# Patient Record
Sex: Female | Born: 1995 | Race: Black or African American | Hispanic: No | Marital: Single | Smoking: Former smoker
Health system: Southern US, Community
[De-identification: ages and names within clinical notes are randomized; demographics above are authoritative.]

## PROBLEM LIST (undated history)

## (undated) HISTORY — PX: EYE SURGERY: SHX253

## (undated) HISTORY — PX: OTHER SURGICAL HISTORY: SHX169

---
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
## 2014-02-15 ENCOUNTER — Encounter (HOSPITAL_COMMUNITY): Payer: Self-pay | Admitting: Family Medicine

## 2014-02-15 ENCOUNTER — Emergency Department (HOSPITAL_COMMUNITY)
Admission: EM | Admit: 2014-02-15 | Discharge: 2014-02-15 | Disposition: A | Discharge status: Home / Self Care | Payer: 59 | Attending: Emergency Medicine | Admitting: Emergency Medicine

## 2014-02-15 DIAGNOSIS — K5641 Fecal impaction: Secondary | ICD-10-CM

## 2014-02-15 DIAGNOSIS — K5900 Constipation, unspecified: Secondary | ICD-10-CM

## 2014-02-15 DIAGNOSIS — K644 Residual hemorrhoidal skin tags: Secondary | ICD-10-CM | POA: Diagnosis not present

## 2014-02-15 MED ORDER — HYDROCORTISONE 2.5 % RE CREA: TOPICAL_CREAM | RECTAL | Status: DC

## 2014-02-15 MED ORDER — BISACODYL 10 MG RE SUPP
10.0000 mg | Freq: Once | RECTAL | Status: AC
Start: 2014-02-15 — End: 2014-02-15
  Administered 2014-02-15: 10 mg via RECTAL
  Filled 2014-02-15: qty 1

## 2014-02-15 MED ORDER — LACTULOSE 10 GM/15ML PO SOLN
20.0000 g | Freq: Once | ORAL | Status: AC
  Administered 2014-02-15: 20 g via ORAL
  Filled 2014-02-15: qty 30

## 2014-02-15 MED ORDER — POLYETHYLENE GLYCOL 3350 17 GM/SCOOP PO POWD: 17.0000 g | Freq: Two times a day (BID) | ORAL | Status: DC

## 2014-02-15 NOTE — Discharge Instructions (Signed)
1. Medications: anusol, miralax, usual home medications 2. Treatment: rest, drink plenty of fluids, begn health bowel habits 3. Follow Up: Please followup with your primary doctor in 3 days for discussion of your diagnoses and further evaluation after today's visit; if you do not have a primary care doctor use the resource guide provided to find one; Please return to the ER for abdominal pain, rectal bleeding, fever or other concerns   Constipation Constipation is when a person has fewer than three bowel movements a week, has difficulty having a bowel movement, or has stools that are dry, hard, or larger than normal. As people grow older, constipation is more common. If you try to fix constipation with medicines that make you have a bowel movement (laxatives), the problem may get worse. Long-term laxative use may cause the muscles of the colon to become weak. A low-fiber diet, not taking in enough fluids, and taking certain medicines may make constipation worse.  CAUSES   Certain medicines, such as antidepressants, pain medicine, iron supplements, antacids, and water pills.   Certain diseases, such as diabetes, irritable bowel syndrome (IBS), thyroid disease, or depression.   Not drinking enough water.   Not eating enough fiber-rich foods.   Stress or travel.   Lack of physical activity or exercise.   Ignoring the urge to have a bowel movement.   Using laxatives too much.  SIGNS AND SYMPTOMS   Having fewer than three bowel movements a week.   Straining to have a bowel movement.   Having stools that are hard, dry, or larger than normal.   Feeling full or bloated.   Pain in the lower abdomen.   Not feeling relief after having a bowel movement.  DIAGNOSIS  Your health care provider will take a medical history and perform a physical exam. Further testing may be done for severe constipation. Some tests may include:  A barium enema X-ray to examine your rectum, colon,  and, sometimes, your small intestine.   A sigmoidoscopy to examine your lower colon.   A colonoscopy to examine your entire colon. TREATMENT  Treatment will depend on the severity of your constipation and what is causing it. Some dietary treatments include drinking more fluids and eating more fiber-rich foods. Lifestyle treatments may include regular exercise. If these diet and lifestyle recommendations do not help, your health care provider may recommend taking over-the-counter laxative medicines to help you have bowel movements. Prescription medicines may be prescribed if over-the-counter medicines do not work.  HOME CARE INSTRUCTIONS   Eat foods that have a lot of fiber, such as fruits, vegetables, whole grains, and beans.  Limit foods high in fat and processed sugars, such as french fries, hamburgers, cookies, candies, and soda.   A fiber supplement may be added to your diet if you cannot get enough fiber from foods.   Drink enough fluids to keep your urine clear or pale yellow.   Exercise regularly or as directed by your health care provider.   Go to the restroom when you have the urge to go. Do not hold it.   Only take over-the-counter or prescription medicines as directed by your health care provider. Do not take other medicines for constipation without talking to your health care provider first.  SEEK IMMEDIATE MEDICAL CARE IF:   You have bright red blood in your stool.   Your constipation lasts for more than 4 days or gets worse.   You have abdominal or rectal pain.   You have thin,  pencil-like stools.   You have unexplained weight loss. MAKE SURE YOU:   Understand these instructions.  Will watch your condition.  Will get help right away if you are not doing well or get worse. Document Released: 11/25/2003 Document Revised: 03/03/2013 Document Reviewed: 12/08/2012 [facility] Patient Information 2015 [place], [place]. This information is not intended to  replace advice given to you by your health care provider. Make sure you discuss any questions you have with your health care provider.     Emergency Department Resource Guide 1) Find a Doctor and Pay Out of Pocket Although you won't have to find out who is covered by your insurance plan, it is a good idea to ask around and get recommendations. You will then need to call the office and see if the doctor you have chosen will accept you as a new patient and what types of options they offer for patients who are self-pay. Some doctors offer discounts or will set up payment plans for their patients who do not have insurance, but you will need to ask so you aren't surprised when you get to your appointment.  2) Contact Your Local Health Department Not all health departments have doctors that can see patients for sick visits, but many do, so it is worth a call to see if yours does. If you don't know where your local health department is, you can check in your phone book. The CDC also has a tool to help you locate your state's health department, and many state websites also have listings of all of their local health departments.  3) Find a Walk-in Clinic If your illness is not likely to be very severe or complicated, you may want to try a walk in clinic. These are popping up all over the country in pharmacies, drugstores, and shopping centers. They're usually staffed by nurse practitioners or physician assistants that have been trained to treat common illnesses and complaints. They're usually fairly quick and inexpensive. However, if you have serious medical issues or chronic medical problems, these are probably not your best option.  No Primary Care Doctor: - Call Health Connect at  [phone] - they can help you locate a primary care doctor that  accepts your insurance, provides certain services, etc. - Physician Referral Service- [phone]  Chronic Pain Problems: Organization          Address  Phone   Notes  [facility]  [phone] Patients need to be referred by their primary care doctor.   Medication Assistance: Organization         Address  Phone   Notes  [facility] Medication [facility] [address] [place], [place] [address] [phone] --Must be a resident of [facility] -- Must have NO insurance coverage whatsoever (no Medicaid/ Medicare, etc.) -- The pt. MUST have a primary care doctor that directs their care regularly and follows them in the community   [facility]ist  [phone]   [employer]  [phone]    Agencies that provide inexpensive medical care: Organization         Address  Phone   Notes  [facility]  [phone]   [facility]    [phone]   [facility] [address] [place], [place] [address] [phone]   [facility] [address] [place], [place] [phone]   Planned Parenthood    [phone]  [facility]    [phone]   [facility]  [address], [place] Phone:  [phone], Fax:  [phone]) [phone] Hours of Operation:  9 am - 6 pm, M-F.  Also accepts Medicaid/Medicare and self-pay.  [facility]  [address], [place] Phone: [phone], Fax: [phone]) [phone]. Hours of Operation:  8:30 am - 5:30 pm, M-F.  Also accepts Medicaid and self-pay.  [facility] [place] [address], [place] Phone: [phone]   [facility] [address], [place], [place] [phone], Ext. [phone] Mondays & Thursdays: 7-9 AM.  First 15 patients are seen on a first come, first serve basis.    Medicaid-accepting [facility] Providers:  Organization         Address  Phone   Notes  [facility] [address], [place] [phone]) [phone] Also accepts self-pay patients.  [facility] [address], [place]  [phone]   [facility] [address], [place] [phone]) [phone]   [facility] [address] [place] [address], [place] [phone]) [phone]   Renaye RakersVeita Bland [address], [place]   [phone]) [phone] Only accepts [place] Access [place] patients after they have their name applied to their card.   Self-Pay (no insurance) in [facility]:  Organization         Address  Phone   Notes  Sickle Cell Patients, [facility] [address], [place] [phone]) [phone]   [facility] [address], [place] [phone]) [phone]   [facility]  [address], [place] [phone](336) [phone]   [facility]/Dr. Osei-Bonsu  [address] [place] [address], [place] or [phone], [place] [phone]) [phone] Phone number for both [place] and [place] locations is the same.  [facility] [address], [place] [phone]) [phone]   [facility] [place] [address] [place] [address], [place] or [address] [phone]) [phone]   [facility] [address], [place] [phone](336) [phone], phone; [phone](336) [phone], fax Sees patients 1st and 3rd Saturday of every month.  Must not qualify for public or private insurance (i.e. Medicaid, Medicare, [place] Health Choice, Veterans' Benefits)  Household income should be no more than 200% of the poverty level The clinic cannot treat you if you are pregnant or think you are pregnant  Sexually transmitted diseases are not treated at the clinic.    Dental Care: Organization         Address  Phone  Notes  [facility] of [facility] [address] [place] [address], [place] [phone]) [phone] Accepts children up to age 20 who are enrolled in [place]icaid or [place] Health Choice; pregnant women with a Medicaid card; and  children who have applied for Medicaid or [place] Health Choice, but were declined, whose parents can pay a reduced fee at time of service.  [facility] of [facility] [place]  [address], [place] [phone]) [phone] Accepts children up to age 20 who are enrolled in [place] or [place] Health Choice; pregnant women with a Medicaid card; and children who have applied for Medicaid or [place] Health Choice, but were declined, whose parents can pay a reduced fee at time  of service.  [facility]  [address], [place] [phone]) [phone] Patients are seen by appointment only. Walk-ins are not accepted. [facility] will see patients 20 years of age and older. Monday - Tuesday (8am-5pm) Most Wednesdays (8:30-5pm) $30 per visit, cash only  [facility]  [address], [place] [facility] [phone] Patients are seen by appointment only. Walk-ins are not accepted. [facility] will see patients 20 years of age and older. One Wednesday Evening (Monthly: Volunteer Based).  $30 per visit, cash only  [employer] [facility] [facility]  [phone] for adults; Children under age 234, call Graduate Pediatric Dentistry at [phone]. Children aged 464-14, please call [phone] to request a pediatric application.  Dental services are provided in all areas of dental care including fillings, crowns and bridges, complete and partial dentures, implants, gum treatment, root canals, and extractions. Preventive care is also provided. Treatment is provided to both adults and children. Patients are selected via a lottery and there is often a waiting list.   [facility] [address], [place]  [phone] [email or web address]   [facility] [address], [place], [place] [phone], Ext. [phone] Second and Fourth Thursday of each month, opens at 6:30 AM; Clinic ends at 9 AM.  Patients are seen on a first-come first-served  basis, and a limited number are seen during each clinic.   [facility]  [address], [place], [place] [phone]) [phone]   Eligibility Requirements You must have lived in [place], [place], or [place] counties for at least the last three months.   You cannot be eligible for state or federal sponsored National Cityhealthcare insurance, including CIGNAVeterans Administration, [place]icaid, or [employer]Medicare.   You generally cannot be eligible for healthcare insurance through your employer.    How to apply: Eligibility screenings are held every Tuesday and Wednesday afternoon from 1:00 pm until 4:00 pm. You do not need an appointment for the interview!  [facility] [address], [place], [place] [phone]   [facility]  [phone]   [facility]  [phone]   [facility]  [phone]    Behavioral Health Resources in the Community: Intensive Outpatient Programs Organization         Address  Phone  Notes  [facility] [address], [place], [place] [phone]   [facility] [address], [place], [place] [phone]   [facility] [address], [place], [place]  [phone]   [facility] [address],  [place], [place] [phone] or [phone]   Substance Abuse Resources Organization         Address  Phone  Notes  [facility]  [phone]   [facility]  [phone]   [facility]  [phone]   [facility]  [phone]   Residential & Outpatient Substance Abuse Program  [phone]   Psychological Services Organization         Address  Phone  Notes  [facility]  [phone]- [phone]   [facility]  [phone]- [phone]   [facility] [address], [place] [phone] or [phone]    Mobile Crisis Teams Organization          Address  Phone  Notes  [facility], Mobile Crisis Care Unit  [phone]   [facility] Psychotherapeutic  Services  [address] [place], [place] [phone]   [facility] [address] [place] [phone]    Self-Help/Support Groups Organization         Address  Phone             Notes  [facility]ental [facility] Assoc[facility] of [place] - variety of support groups  [phone]- [phone] Call for more information  Narcotics Anonymous (NA), [facility] [address], [employer] [place]  2 meetings at this location   Statistician         Address  Phone  Notes  [facility] [address],    [place]  [phone]   [facility]  [address] [place] [address], [place], [place] [phone]   [facility] [address] [place], [place] [phone] Admissions: 8am-3pm M-F  [facility] [address]    [place], [place] [phone]   [facility] [address] [place], [place], [place] [phone]   [facility] [address]  [place], [place] [phone]   [facility] - Intensive Outpatient [address], [place], [place] [phone]   [facility] ([facility] [address]  [place], [place] [phone] or [phone]   [facility] ([facility]) [address], [place], [place] [phone] Accepts Medicaid  [facility] [address]  [place] [phone] Substance Abuse/Addiction Treatment   [facility] Organization         Address  Phone  Notes  [facility]  [phone]   Angie Fava, PhD [address] [place], [place]   [phone] or [phone]   [facility] Behavioral   [address] [place], [place] [phone]   [facility] [address] [place], [place] [phone] Insurance/Medicaid/sponsorship  through [facility] [address]                                    [place], [place] [phone] Therapy/tele-psych/case  [facility] [address], [place] [phone]    Dr. Lolly Mustache  [phone]   [facility] [place]  [employer] [facility] Dept. 1) [address] [place] 2) [address] [place] 3)  [address], [place] [phone]  [phone]   [facility] Child Abuse Hotline [phone] or [phone] (After Hours)

## 2014-02-15 NOTE — ED Notes (Signed)
Per pt sts she has been constipated x 5 days and feels ike it is right at her rectum and will not come out. sts pressure. Denies any other associated symptoms.

## 2014-02-15 NOTE — ED Provider Notes (Signed)
CSN: [id number]     Arrival date & time 02/15/14  1804 History   First MD Initiated Contact with Patient 02/15/14 1941     Chief Complaint  Patient presents with  . Fecal Impaction     (Consider location/radiation/quality/duration/timing/severity/associated sxs/prior Treatment) The history is provided by the patient and medical records. No language interpreter was used.     Daisy Torres is a 18 y.o. female  with no major medical Hx presents to the Emergency Department complaining of gradual, persistent, progressively worsening constipation onset 5 days ago.  Pt reports she normally has soft BMs every other day, but has not has not felt the urge to defecate this week.  Pt reports she drinks plenty of water (2-3 cups) and admits to not eating healthy including a large amount of fast food and junk food.  Pt reports she does not have a hx of constipation.  Pt reports she has not attempted to take anything for the constipation.  Associated symptoms include rectal pain, but denies fever chills, abd pain, hematochezia or melena.  No aggravating or alleviating factors.    History reviewed. No pertinent past medical history. History reviewed. No pertinent past surgical history. History reviewed. No pertinent family history. History  Substance Use Topics  . Smoking status: Never Smoker   . Smokeless tobacco: Not on file  . Alcohol Use: Not on file   OB History    No data available     Review of Systems  Constitutional: Negative for fever, diaphoresis, appetite change, fatigue and unexpected weight change.  HENT: Negative for mouth sores.   Eyes: Negative for visual disturbance.  Respiratory: Negative for cough, chest tightness, shortness of breath and wheezing.   Cardiovascular: Negative for chest pain.  Gastrointestinal: Positive for constipation. Negative for nausea, vomiting, abdominal pain and diarrhea.  Endocrine: Negative for polydipsia, polyphagia and polyuria.  Genitourinary:  Negative for dysuria, urgency, frequency and hematuria.  Musculoskeletal: Negative for back pain and neck stiffness.  Skin: Negative for rash.  Allergic/Immunologic: Negative for immunocompromised state.  Neurological: Negative for syncope, light-headedness and headaches.  Hematological: Does not bruise/bleed easily.  Psychiatric/Behavioral: Negative for sleep disturbance. The patient is not nervous/anxious.       Allergies  Review of patient's allergies indicates no known allergies.  Home Medications   Prior to Admission medications   Medication Sig Start Date End Date Taking? Authorizing Provider  hydrocortisone (ANUSOL-HC) 2.5 % rectal cream Apply rectally 2 times daily 02/15/14   Dahlia ClientHannah Tangie Stay, PA-C  polyethylene glycol powder (GLYCOLAX/MIRALAX) powder Take 17 g by mouth 2 (two) times daily. Until daily soft stools  OTC 02/15/14   Matthews Franks, PA-C   BP 96/53 mmHg  Pulse 87  Temp(Src) 99 F (37.2 C) (Oral)  Resp 16  Ht 5\' 4"  (1.626 m)  Wt 125 lb (56.7 kg)  BMI 21.45 kg/m2  SpO2 99% Physical Exam  Constitutional: She appears well-developed and well-nourished. No distress.  Awake, alert, nontoxic appearance  HENT:  Head: Normocephalic and atraumatic.  Mouth/Throat: Oropharynx is clear and moist. No oropharyngeal exudate.  Eyes: Conjunctivae are normal. No scleral icterus.  Neck: Normal range of motion. Neck supple.  Cardiovascular: Normal rate, regular rhythm, normal heart sounds and intact distal pulses.   No murmur heard. Pulmonary/Chest: Effort normal and breath sounds normal. No respiratory distress. She has no wheezes.  Equal chest expansion  Abdominal: Soft. Bowel sounds are normal. She exhibits no distension and no mass. There is no tenderness. There is no  rebound and no guarding.  Genitourinary: Rectal exam shows external hemorrhoid. Rectal exam shows no internal hemorrhoid, no fissure, no mass, no tenderness and anal tone normal.  External  hemorrhoid without fissure Large stool burden in the rectum with small amount of hard stool able to be disimpacted; additional stool is softer and does not feel impacted.  Musculoskeletal: Normal range of motion. She exhibits no edema.  Neurological: She is alert.  Speech is clear and goal oriented Moves extremities without ataxia  Skin: Skin is warm and dry. She is not diaphoretic.  Psychiatric: She has a normal mood and affect.  Nursing note and vitals reviewed.   ED Course  Fecal disimpaction Date/Time: 02/15/2014 10:09 PM Performed by: Dierdre ForthMUTHERSBAUGH, Rossie Bretado Authorized by: Dierdre ForthMUTHERSBAUGH, Albertina Leise Consent: Verbal consent obtained. Risks and benefits: risks, benefits and alternatives were discussed Consent given by: patient Patient understanding: patient states understanding of the procedure being performed Patient consent: the patient's understanding of the procedure matches consent given Procedure consent: procedure consent matches procedure scheduled Relevant documents: relevant documents present and verified Site marked: the operative site was marked Required items: required blood products, implants, devices, and special equipment available Patient identity confirmed: verbally with patient and arm band Time out: Immediately prior to procedure a "time out" was called to verify the correct patient, procedure, equipment, support staff and site/side marked as required. Local anesthesia used: no Patient sedated: no Patient tolerance: Patient tolerated the procedure well with no immediate complications Comments: Disimpaction of a small amount of hard brown stool   (including critical care time) Labs Review Labs Reviewed - No data to display  Imaging Review No results found.   EKG Interpretation None      MDM   Final diagnoses:  Constipation, unspecified constipation type  Fecal impaction in rectum  External hemorrhoid   Daisy MageShamari N Shumard presents with 5 days of  constipation.  DRE with large stool burden in the rectum and only a small amount of hard stool to be disimpacted. Remaining stool is softer in nature.  Patient to be given a laxative here in the emergency department and discharged home with Anusol and MiraLAX. No pain with DRE or to the perineum to suggest Fournier's gangrene, rectal abscess and no evidence of rectovaginal fistula. Abdomen soft and nontender without distention.   I have personally reviewed patient's vitals, nursing note and any pertinent labs or imaging.  I performed an undressed physical exam.    It has been determined that no acute conditions requiring further emergency intervention are present at this time. The patient/guardian have been advised of the diagnosis and plan. I reviewed all labs and imaging including any potential incidental findings. We have discussed signs and symptoms that warrant return to the ED and they are listed in the discharge instructions.    Vital signs are stable at discharge.   BP 96/53 mmHg  Pulse 87  Temp(Src) 99 F (37.2 C) (Oral)  Resp 16  Ht 5\' 4"  (1.626 m)  Wt 125 lb (56.7 kg)  BMI 21.45 kg/m2  SpO2 99%        Dierdre ForthHannah Storm Sovine, PA-C 02/15/14 2327  Dierdre ForthHannah Margarie Mcguirt, PA-C 02/15/14 2328  Rolland PorterMark James, MD 02/23/14 2244

## 2014-08-31 ENCOUNTER — Encounter: Payer: Self-pay | Admitting: Emergency Medicine

## 2014-08-31 ENCOUNTER — Emergency Department: Payer: Medicaid Other

## 2014-08-31 DIAGNOSIS — Z7952 Long term (current) use of systemic steroids: Secondary | ICD-10-CM | POA: Diagnosis not present

## 2014-08-31 DIAGNOSIS — Z79899 Other long term (current) drug therapy: Secondary | ICD-10-CM | POA: Diagnosis not present

## 2014-08-31 DIAGNOSIS — Y9289 Other specified places as the place of occurrence of the external cause: Secondary | ICD-10-CM | POA: Insufficient documentation

## 2014-08-31 DIAGNOSIS — S100XXA Contusion of throat, initial encounter: Secondary | ICD-10-CM | POA: Insufficient documentation

## 2014-08-31 DIAGNOSIS — Y998 Other external cause status: Secondary | ICD-10-CM | POA: Insufficient documentation

## 2014-08-31 DIAGNOSIS — Y9389 Activity, other specified: Secondary | ICD-10-CM | POA: Insufficient documentation

## 2014-08-31 DIAGNOSIS — X58XXXA Exposure to other specified factors, initial encounter: Secondary | ICD-10-CM | POA: Insufficient documentation

## 2014-08-31 DIAGNOSIS — J029 Acute pharyngitis, unspecified: Secondary | ICD-10-CM | POA: Diagnosis present
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
## 2014-09-01 ENCOUNTER — Emergency Department
Admission: EM | Admit: 2014-09-01 | Discharge: 2014-09-01 | Disposition: A | Discharge status: Home / Self Care | Payer: Medicaid Other | Attending: Emergency Medicine | Admitting: Emergency Medicine

## 2014-09-01 DIAGNOSIS — S100XXA Contusion of throat, initial encounter: Secondary | ICD-10-CM

## 2014-09-01 MED ORDER — IBUPROFEN 800 MG PO TABS
ORAL_TABLET | ORAL | Status: AC
  Administered 2014-09-01: 800 mg via ORAL
  Filled 2014-09-01: qty 1

## 2014-09-01 MED ORDER — GI COCKTAIL ~~LOC~~
ORAL | Status: AC
  Administered 2014-09-01: 30 mL via ORAL
  Filled 2014-09-01: qty 30

## 2014-09-01 MED ORDER — GI COCKTAIL ~~LOC~~
30.0000 mL | Freq: Once | ORAL | Status: AC
  Administered 2014-09-01: 30 mL via ORAL

## 2014-09-01 MED ORDER — IBUPROFEN 800 MG PO TABS
800.0000 mg | ORAL_TABLET | Freq: Once | ORAL | Status: AC
Start: 2014-09-01 — End: 2014-09-01
  Administered 2014-09-01: 800 mg via ORAL

## 2014-09-01 NOTE — Discharge Instructions (Signed)
Contusion A contusion is a deep bruise. Contusions are the result of an injury that caused bleeding under the skin. The contusion may turn blue, purple, or yellow. Minor injuries will give you a painless contusion, but more severe contusions may stay painful and swollen for a few weeks.  CAUSES  A contusion is usually caused by a blow, trauma, or direct force to an area of the body. SYMPTOMS   Swelling and redness of the injured area.  Bruising of the injured area.  Tenderness and soreness of the injured area.  Pain. DIAGNOSIS  The diagnosis can be made by taking a history and physical exam. An X-ray, CT scan, or MRI may be needed to determine if there were any associated injuries, such as fractures. TREATMENT  Specific treatment will depend on what area of the body was injured. In general, the best treatment for a contusion is resting, icing, elevating, and applying cold compresses to the injured area. Over-the-counter medicines may also be recommended for pain control. Ask your caregiver what the best treatment is for your contusion. HOME CARE INSTRUCTIONS   Put ice on the injured area.  Put ice in a plastic bag.  Place a towel between your skin and the bag.  Leave the ice on for 15-20 minutes, 3-4 times a day, or as directed by your health care provider.  Only take over-the-counter or prescription medicines for pain, discomfort, or fever as directed by your caregiver. Your caregiver may recommend avoiding anti-inflammatory medicines (aspirin, ibuprofen, and naproxen) for 48 hours because these medicines may increase bruising.  Rest the injured area.  If possible, elevate the injured area to reduce swelling. SEEK IMMEDIATE MEDICAL CARE IF:   You have increased bruising or swelling.  You have pain that is getting worse.  Your swelling or pain is not relieved with medicines. MAKE SURE YOU:   Understand these instructions.  Will watch your condition.  Will get help right  away if you are not doing well or get worse. Document Released: 12/06/2004 Document Revised: 03/03/2013 Document Reviewed: 01/01/2011 [facility] Patient Information 2015 [place], [place]. This information is not intended to replace advice given to you by your health care provider. Make sure you discuss any questions you have with your health care provider.  Soft Tissue Injury of the Neck A soft tissue injury of the neck may be either blunt or penetrating. A blunt injury does not break the skin. A penetrating injury breaks the skin, creating an open wound. Blunt injuries may happen in several ways. Most involve some type of direct blow to the neck. This can cause serious injury to the windpipe, voice box, cervical spine, or esophagus. In some cases, the injury to the soft tissue can also result in a break (fracture) of the cervical spine.  Soft tissue injuries of the neck require immediate medical care. Sometimes, you may not notice the signs of injury right away. You may feel fine at first, but the swelling may eventually close off your airway. This could result in a significant or life-threatening injury. This is rare, but it is important to keep in mind with any injury to the neck.  CAUSES  Causes of blunt injury may include:  "Clothesline" injuries. This happens when someone is moving at high speed and runs into a clothesline, outstretched arm, or similar object. This results in a direct injury to the front of the neck. If the airway is blocked, it can cause suffocation due to lack of oxygen (asphyxiation) or even  instant death.  High-energy trauma. This includes injuries from motor vehicle crashes, falling from a great height, or heavy objects falling onto the neck.  Sports-related injuries. Injury to the windpipe and voice box can result from being struck by another player or being struck by an object, such as a baseball, hockey stick, or an outstretched arm.  Strangulation. This type of injury may  cause skin trauma, hoarseness of voice, or broken cartilage in the voice box or windpipe. It may also cause a serious airway problem. SYMPTOMS   Bruising.  Pain and tenderness in the neck.  Swelling of the neck and face.  Hoarseness of voice.  Pain or difficulty with swallowing.  Drooling or inability to swallow.  Trouble breathing. This may become worse when lying flat.  Coughing up blood.  High-pitched, harsh, vibratory noise due to partial obstruction of the windpipe (stridor).  Swelling of the upper arms.  Windpipe that appears to be pushed off to one side.  Air in the tissues under the skin of the neck or chest (subcutaneous emphysema). This usually indicates a problem with the normal airway and is a medical emergency. DIAGNOSIS   If possible, your caregiver may ask about the details of how the injury occurred. A detailed exam can help to identify specific areas of the neck that are injured.  Your caregiver may ask for tests to rule out injury of the voice box, airway, or esophagus. This may include X-rays, ultrasounds, CT scans, or MRI scans, depending on the severity of your injury. TREATMENT  If you have an injury to your windpipe or voice box, immediate medical care is required. In almost all cases, hospitalization is necessary. For injuries that do not appear to require surgery, it is helpful to have medical observation for 24 hours. You may be asked to do one or more of the following:  Rest your voice.  Bed rest.  Limit your diet, depending on the extent of the injury. Follow your caregiver's dietary guidelines. Often, only fluids and soft foods are recommended.  Keep your head raised.  Breathe humidified air.  Take medicines to control infection, reduce swelling, and reduce normal stomach acid. You may also need pain medicine, depending on your injury. For injuries that appear to require surgery, you will need to stay in the hospital. The exact type of  procedure needed will depend on your exact injury or injuries.  HOME CARE INSTRUCTIONS   If the skin was broken, keep the wound area clean and dry. Wear your bandage (dressing) and care for your wound as instructed.  Follow your caregiver's advice about your diet.  Follow your caregiver's advice about use of your voice.  Take medicines as directed.  Keep your head and neck at least partially raised (elevated) while recovering. This should also be done while sleeping. SEEK MEDICAL CARE IF:   Your voice becomes weaker.  Your swelling or bruising is not improving as expected. Typically, this takes several days to improve.  You feel that you are having problems with medicines prescribed.  You have drainage from the injury site. This may be a sign that your wound is not healing properly or is infected.  You develop increasing pain or difficulty while swallowing.  You develop an oral temperature of 102 F (38.9 C) or higher. SEEK IMMEDIATE MEDICAL CARE IF:   You cough up blood.  You develop sudden trouble breathing.  You cannot tolerate your oral medicines, or you are unable to swallow.  You develop  drooling.  You have new or worsening vomiting.  You develop sudden, new swelling of the neck or face.  You have an oral temperature above 102 F (38.9 C), not controlled by medicine. MAKE SURE YOU:  Understand these instructions.  Will watch your condition.  Will get help right away if you are not doing well or get worse. Document Released: 06/05/2007 Document Revised: 05/21/2011 Document Reviewed: 05/15/2010 [facility] Patient Information 2015 [place], [place]. This information is not intended to replace advice given to you by your health care provider. Make sure you discuss any questions you have with your health care provider.
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
## 2015-04-14 LAB — HM PAP SMEAR: HM Pap smear: NEGATIVE

## 2015-06-01 ENCOUNTER — Emergency Department
Admission: EM | Admit: 2015-06-01 | Discharge: 2015-06-01 | Disposition: A | Discharge status: Home / Self Care | Payer: Self-pay | Attending: Emergency Medicine | Admitting: Emergency Medicine

## 2015-06-01 ENCOUNTER — Emergency Department: Payer: Self-pay

## 2015-06-01 DIAGNOSIS — Z79899 Other long term (current) drug therapy: Secondary | ICD-10-CM | POA: Insufficient documentation

## 2015-06-01 DIAGNOSIS — Z7952 Long term (current) use of systemic steroids: Secondary | ICD-10-CM | POA: Insufficient documentation

## 2015-06-01 DIAGNOSIS — F419 Anxiety disorder, unspecified: Secondary | ICD-10-CM | POA: Insufficient documentation

## 2015-06-01 DIAGNOSIS — R0789 Other chest pain: Secondary | ICD-10-CM | POA: Insufficient documentation

## 2015-06-01 MED ORDER — NAPROXEN 500 MG PO TABS: 500.0000 mg | ORAL_TABLET | Freq: Two times a day (BID) | ORAL | Status: DC

## 2015-06-01 MED ORDER — NAPROXEN 500 MG PO TABS
500.0000 mg | ORAL_TABLET | Freq: Once | ORAL | Status: AC
Start: 2015-06-01 — End: 2015-06-01
  Administered 2015-06-01: 500 mg via ORAL
  Filled 2015-06-01: qty 1

## 2015-06-01 NOTE — ED Notes (Signed)
Per Dr. Scotty CourtStafford, no lab work at this time.

## 2015-06-01 NOTE — ED Provider Notes (Signed)
EKG interpreted by me  Date: 06/01/2015  Rate: 84  Rhythm: normal sinus rhythm  QRS Axis: normal  Intervals: normal  ST/T Wave abnormalities: normal  Conduction Disutrbances: none  Narrative Interpretation: unremarkable      Daisy CheekPhillip Sydney Azure, MD 06/01/15 1919

## 2015-06-01 NOTE — ED Notes (Signed)
Pt reports chest discomfort when she gets anxious. Thinks its may be coming from have some anxiety and stress. Pt dealing with everyday life stressors. No thoughts of SI or HI. Pt in no acute distress at this time. Does have PCP.

## 2015-06-01 NOTE — ED Notes (Signed)
Pt c/o having substernal chest pain last night, states she has been having issues with it over the past year, states seems to flare up when she is under stress. Pt is in NAD at present.

## 2015-06-01 NOTE — ED Provider Notes (Signed)
[facility] Emergency Department Provider Note  ____________________________________________  Time seen: Approximately 12:12 PM  I have reviewed the triage vital signs and the nursing notes.   HISTORY  Chief Complaint Chest Pain   HPI Daisy Torres is a 20 y.o. female is here with complaint of chest pain. Patient states that she has chest discomfort when she gets anxious. She thinks that this is coming from a lot of anxiety and stress that she is under right now. She denies any thoughts of suicide. She denies taking any over-the-counter medication for her chest pain, denies the use of recreational drugs, no smoking. She denies any fever or chills. She states that her chest hurts when touching. Patient states she's had this in the past and was told that it was anxiety. She rates her pain is 7 out of 10.   History reviewed. No pertinent past medical history.  There are no active problems to display for this patient.   History reviewed. No pertinent past surgical history.  Current Outpatient Rx  Name  Route  Sig  Dispense  Refill  . hydrocortisone (ANUSOL-HC) 2.5 % rectal cream      Apply rectally 2 times daily   28.35 g   0   . naproxen (NAPROSYN) 500 MG tablet   Oral   Take 1 tablet (500 mg total) by mouth 2 (two) times daily with a meal.   30 tablet   2   . polyethylene glycol powder (GLYCOLAX/MIRALAX) powder   Oral   Take 17 g by mouth 2 (two) times daily. Until daily soft stools  OTC   119 g   2     Allergies Review of patient's allergies indicates no known allergies.  No family history on file.  Social History Social History  Substance Use Topics  . Smoking status: Never Smoker   . Smokeless tobacco: None  . Alcohol Use: No    Review of Systems Constitutional: No fever/chills ENT: No sore throat. Cardiovascular: Denies chest pain. Respiratory: Denies shortness of breath. Gastrointestinal: No abdominal pain.  No nausea, no  vomiting.  No diarrhea.   Genitourinary: Negative for dysuria. Musculoskeletal: Negative for back pain.Positive anterior chest wall pain. Skin: Negative for rash. Neurological: Negative for headaches, focal weakness or numbness.  10-point ROS otherwise negative.  ____________________________________________   PHYSICAL EXAM:  VITAL SIGNS: ED Triage Vitals  Enc Vitals Group     BP 06/01/15 1148 118/69 mmHg     Pulse Rate 06/01/15 1148 92     Resp 06/01/15 1148 15     Temp 06/01/15 1148 98.4 F (36.9 C)     Temp Source 06/01/15 1148 Oral     SpO2 06/01/15 1148 99 %     Weight 06/01/15 1148 138 lb (62.596 kg)     Height 06/01/15 1148  (1.626 m)     Head Cir --      Peak Flow --      Pain Score 06/01/15 1148 7     Pain Loc --      Pain Edu? --      Excl. in GC? --     Constitutional: Alert and oriented. Well appearing and in no acute distress.Patient is lying on stretcher Texting on the phone in no apparent distress. Eyes: Conjunctivae are normal. PERRL. EOMI. Head: Atraumatic. Nose: No congestion/rhinnorhea.EACs and TMs are clear bilaterally. Mouth/Throat: Mucous membranes are moist.  Oropharynx non-erythematous. Neck: No stridor.  Supple Hematological/Lymphatic/Immunilogical: No cervical lymphadenopathy. Cardiovascular: Normal  rate, regular rhythm. Grossly normal heart sounds.  Good peripheral circulation. Respiratory: Normal respiratory effort.  No retractions. Lungs CTAB. Gastrointestinal: Soft and nontender. No distention. Bowel sounds are normoactive. Musculoskeletal: Anterior chest wall and sternum were tender to palpation at the sternoclavicular joints. There is no gross deformity. Patient denies any reproduction of her pain on exertion. Neurologic:  Normal speech and language. No gross focal neurologic deficits are appreciated. No gait instability. Skin:  Skin is warm, dry and intact. No rash noted. Psychiatric: Mood and affect are normal. Speech and behavior  are normal.  ____________________________________________   LABS (all labs ordered are listed, but only abnormal results are displayed)  Labs Reviewed - No data to display RADIOLOGY  Chest x-ray per radiology shows no active cardiopulmonary disease. ____________________________________________   PROCEDURES  Procedure(s) performed: None  Critical Care performed: No  ____________________________________________   INITIAL IMPRESSION / ASSESSMENT AND PLAN / ED COURSE  Pertinent labs & imaging results that were available during my care of the patient were reviewed by me and considered in my medical decision making (see chart for details).  ----------------------------------------- 2:03 PM on 06/01/2015 ----------------------------------------- Patient has improved with her chest wall pain after taking naproxen.  ____________________________________________   FINAL CLINICAL IMPRESSION(S) / ED DIAGNOSES  Final diagnoses:  Anterior chest wall pain      Tommi RumpsRhonda L Summers, PA-C 06/01/15 1539  Myrna Blazeravid Matthew Schaevitz, MD 06/06/15 2040

## 2015-06-01 NOTE — Discharge Instructions (Signed)
Chest Wall Pain Chest wall pain is pain in or around the bones and muscles of your chest. Sometimes, an injury causes this pain. Sometimes, the cause may not be known. This pain may take several weeks or longer to get better. HOME CARE Pay attention to any changes in your symptoms. Take these actions to help with your pain:  Rest as told by your doctor.  Avoid activities that cause pain. Try not to use your chest, belly (abdominal), or side muscles to lift heavy things.  If directed, apply ice to the painful area:  Put ice in a plastic bag.  Place a towel between your skin and the bag.  Leave the ice on for 20 minutes, 2-3 times per day.  Take over-the-counter and prescription medicines only as told by your doctor.  Do not use tobacco products, including cigarettes, chewing tobacco, and e-cigarettes. If you need help quitting, ask your doctor.  Keep all follow-up visits as told by your doctor. This is important. GET HELP IF:  You have a fever.  Your chest pain gets worse.  You have new symptoms. GET HELP RIGHT AWAY IF:  You feel sick to your stomach (nauseous) or you throw up (vomit).  You feel sweaty or light-headed.  You have a cough with phlegm (sputum) or you cough up blood.  You are short of breath.   This information is not intended to replace advice given to you by your health care provider. Make sure you discuss any questions you have with your health care provider.   Document Released: 08/15/2007 Document Revised: 11/17/2014 Document Reviewed: 05/24/2014 Elsevier Interactive Patient Education 2016 [employer]  You [facility] to [facility] if any continued problems. Begin taking naproxen 500 mg twice a day with food. Return to the emergency room if any severe worsening of your symptoms such as shortness of breath or difficulty breathing.

## 2016-12-18 DIAGNOSIS — E663 Overweight: Secondary | ICD-10-CM | POA: Insufficient documentation

## 2016-12-19 DIAGNOSIS — F4323 Adjustment disorder with mixed anxiety and depressed mood: Secondary | ICD-10-CM | POA: Insufficient documentation

## 2017-05-11 ENCOUNTER — Emergency Department
Admission: EM | Admit: 2017-05-11 | Discharge: 2017-05-11 | Disposition: A | Discharge status: Home / Self Care | Payer: No Typology Code available for payment source | Attending: Emergency Medicine | Admitting: Emergency Medicine

## 2017-05-11 ENCOUNTER — Other Ambulatory Visit: Payer: Self-pay

## 2017-05-11 ENCOUNTER — Emergency Department: Payer: No Typology Code available for payment source

## 2017-05-11 DIAGNOSIS — M7918 Myalgia, other site: Secondary | ICD-10-CM | POA: Insufficient documentation

## 2017-05-11 DIAGNOSIS — Z79899 Other long term (current) drug therapy: Secondary | ICD-10-CM | POA: Insufficient documentation

## 2017-05-11 LAB — POCT PREGNANCY, URINE: Preg Test, Ur: NEGATIVE

## 2017-05-11 MED ORDER — KETOROLAC TROMETHAMINE 30 MG/ML IJ SOLN
30.0000 mg | Freq: Once | INTRAMUSCULAR | Status: AC
  Administered 2017-05-11: 30 mg via INTRAMUSCULAR
  Filled 2017-05-11: qty 1

## 2017-05-11 MED ORDER — MELOXICAM 7.5 MG PO TABS: 7.5000 mg | ORAL_TABLET | Freq: Every day | ORAL | 1 refills | Status: AC

## 2017-05-11 MED ORDER — CYCLOBENZAPRINE HCL 5 MG PO TABS: 5.0000 mg | ORAL_TABLET | Freq: Three times a day (TID) | ORAL | 0 refills | Status: AC | PRN

## 2017-05-11 MED ORDER — CYCLOBENZAPRINE HCL 10 MG PO TABS
5.0000 mg | ORAL_TABLET | Freq: Once | ORAL | Status: DC
  Filled 2017-05-11: qty 1
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
## 2018-05-09 LAB — HM HIV SCREENING LAB: HM HIV Screening: NEGATIVE
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
## 2018-11-06 ENCOUNTER — Ambulatory Visit: Payer: Self-pay

## 2018-11-20 ENCOUNTER — Other Ambulatory Visit: Payer: Self-pay

## 2018-11-20 ENCOUNTER — Ambulatory Visit (LOCAL_COMMUNITY_HEALTH_CENTER): Payer: Medicaid Other | Admitting: Physician Assistant

## 2018-11-20 VITALS — BP 125/85 | Ht 65.0 in | Wt 189.2 lb

## 2018-11-20 DIAGNOSIS — Z5321 Procedure and treatment not carried out due to patient leaving prior to being seen by health care provider: Secondary | ICD-10-CM

## 2018-11-20 NOTE — Progress Notes (Signed)
In for restart Depo; desires wet prep due to [place] s/s Debera Lat, RN Unable to wait to see provider-rescheduled appt Debera Lat, RN

## 2018-11-24 DIAGNOSIS — F4323 Adjustment disorder with mixed anxiety and depressed mood: Secondary | ICD-10-CM

## 2018-11-24 DIAGNOSIS — E669 Obesity, unspecified: Secondary | ICD-10-CM

## 2018-11-24 DIAGNOSIS — E663 Overweight: Secondary | ICD-10-CM

## 2018-11-25 ENCOUNTER — Ambulatory Visit: Payer: Medicaid Other

## 2018-12-15 ENCOUNTER — Encounter: Payer: Self-pay | Admitting: Advanced Practice Midwife

## 2018-12-15 ENCOUNTER — Other Ambulatory Visit: Payer: Self-pay

## 2018-12-15 ENCOUNTER — Ambulatory Visit (LOCAL_COMMUNITY_HEALTH_CENTER): Payer: Medicaid Other | Admitting: Advanced Practice Midwife

## 2018-12-15 VITALS — BP 131/87 | Ht 65.0 in | Wt 188.0 lb

## 2018-12-15 DIAGNOSIS — Z30013 Encounter for initial prescription of injectable contraceptive: Secondary | ICD-10-CM

## 2018-12-15 DIAGNOSIS — E663 Overweight: Secondary | ICD-10-CM

## 2018-12-15 DIAGNOSIS — Z3009 Encounter for other general counseling and advice on contraception: Secondary | ICD-10-CM

## 2018-12-15 DIAGNOSIS — Z113 Encounter for screening for infections with a predominantly sexual mode of transmission: Secondary | ICD-10-CM

## 2018-12-15 DIAGNOSIS — B9689 Other specified bacterial agents as the cause of diseases classified elsewhere: Secondary | ICD-10-CM

## 2018-12-15 LAB — WET PREP FOR TRICH, YEAST, CLUE
Trichomonas Exam: NEGATIVE
Yeast Exam: NEGATIVE

## 2018-12-15 LAB — PREGNANCY, URINE: Preg Test, Ur: NEGATIVE

## 2018-12-15 MED ORDER — MEDROXYPROGESTERONE ACETATE 150 MG/ML IM SUSP
150.0000 mg | Freq: Once | INTRAMUSCULAR | Status: AC
  Administered 2018-12-15: 150 mg via INTRAMUSCULAR

## 2018-12-15 MED ORDER — METRONIDAZOLE 500 MG PO TABS: 500.0000 mg | ORAL_TABLET | Freq: Two times a day (BID) | ORAL | 0 refills | Status: AC

## 2018-12-15 NOTE — Progress Notes (Signed)
Wet mount reviewed, patient treated for BV per SO. BP reviewed by provider, Depo given and next date for Depo given. Patient told to ask for PE when she calls for next Depo. Patient in agreement and states understanding of plan.Jenetta Downer, RN

## 2018-12-15 NOTE — Progress Notes (Signed)
Patient here for STD screening, thinks she may have BV or yeast. Also wants to restart depo today.Jenetta Downer, RN

## 2018-12-15 NOTE — Progress Notes (Signed)
   [place] problem visit  [facility] Department  Subjective:  Daisy Torres is a 23 y.o. nullip nonsmoker being seen today for STD screen and DMPA restart  Chief Complaint  Patient presents with  . Exposure to STD  . Contraception    Depo    HPI C/o white malodor discharge with external vaginal itching x 1 mo.  Wants to restart DMPA.  Went to [facility] in July for [place] butt lift surgery Nullip nonsmoker with LMP 2015.  Last sex 12/05/18 without condom  Does the patient have a current or past history of drug use? No   No components found for: HCV]   Health Maintenance Due  Topic Date Due  . TETANUS/TDAP  10/04/2012  . PAP-Cervical Cytology Screening  04/13/2018  . PAP SMEAR-Modifier  04/13/2018  . INFLUENZA VACCINE  10/11/2018    ROS  The following portions of the patient's history were reviewed and updated as appropriate: allergies, current medications, past family history, past medical history, past social history, past surgical history and problem list. Problem list updated.   See flowsheet for other program required questions.  Objective:   Vitals:   12/15/18 1439  BP: 131/87  Weight: 188 lb (85.3 kg)  Height: 5\' 5"  (1.651 m)    Physical Exam Constitutional:      Appearance: Normal appearance. She is obese.  HENT:     Head: Atraumatic.     Mouth/Throat:     Mouth: Mucous membranes are moist.  Eyes:     Conjunctiva/sclera: Conjunctivae normal.  Pulmonary:     Effort: Pulmonary effort is normal.     Breath sounds: Normal breath sounds.  Abdominal:     Palpations: Abdomen is soft.     Comments: Soft without tenderness Pt states had [place] butt lift 09/2018 in [place]  Genitourinary:    General: Normal vulva.     Exam position: Lithotomy position.     Labia:        Right: No lesion.        Left: No lesion.      Vagina: Vaginal discharge (white creamy leukorrhea, ph>4.5) present.     Cervix: Normal.     Uterus:  Normal.      Adnexa: Right adnexa normal and left adnexa normal.     Rectum: Normal.  Lymphadenopathy:     Lower Body: No right inguinal adenopathy. No left inguinal adenopathy.  Skin:    General: Skin is warm and dry.  Neurological:     Mental Status: She is alert.  Psychiatric:        Behavior: Behavior normal.       Assessment and Plan:  Daisy Torres is a 23 y.o. female presenting to the [facility] Department for a Women's Health problem visit  1. Overweight BMI=31.2  2. Screening examination for venereal disease Treat wet mount per standing orders Immunization nurse consult - WET PREP FOR [place], YEAST, [facility]  3. Family planning If PT neg today and BP =wnl, may have DMPA 150 mg IM today Please counsel on need for backup condoms/abstinance next 7 days - Pregnancy, urine     Return if symptoms worsen or fail to improve, for PRN.  No future appointments.  Herbie Saxon, CNM

## 2019-01-01 NOTE — Addendum Note (Signed)
Addended by: Tracye Szuch on: 01/01/2019 10:55 AM   Modules accepted: Orders  

## 2019-03-03 ENCOUNTER — Ambulatory Visit: Payer: Medicaid Other

## 2019-03-10 ENCOUNTER — Ambulatory Visit: Payer: Medicaid Other

## 2019-04-01 ENCOUNTER — Other Ambulatory Visit: Payer: Self-pay

## 2019-04-01 ENCOUNTER — Ambulatory Visit (LOCAL_COMMUNITY_HEALTH_CENTER): Payer: Medicaid Other | Admitting: Advanced Practice Midwife

## 2019-04-01 VITALS — BP 114/80 | Ht 64.5 in | Wt 192.0 lb

## 2019-04-01 DIAGNOSIS — Z30013 Encounter for initial prescription of injectable contraceptive: Secondary | ICD-10-CM | POA: Diagnosis not present

## 2019-04-01 DIAGNOSIS — Z3009 Encounter for other general counseling and advice on contraception: Secondary | ICD-10-CM

## 2019-04-01 DIAGNOSIS — Z3042 Encounter for surveillance of injectable contraceptive: Secondary | ICD-10-CM

## 2019-04-01 LAB — WET PREP FOR TRICH, YEAST, CLUE
Trichomonas Exam: NEGATIVE
Yeast Exam: NEGATIVE

## 2019-04-01 MED ORDER — MEDROXYPROGESTERONE ACETATE 150 MG/ML IM SUSP
150.0000 mg | Freq: Once | INTRAMUSCULAR | Status: AC
  Administered 2019-04-01: 150 mg via INTRAMUSCULAR

## 2019-04-01 MED ORDER — MEDROXYPROGESTERONE ACETATE 150 MG/ML IM SUSP: 150.0000 mg | INTRAMUSCULAR | 0 refills | Status: DC

## 2019-04-01 MED ORDER — METRONIDAZOLE 500 MG PO TABS: 500.0000 mg | ORAL_TABLET | Freq: Two times a day (BID) | ORAL | 0 refills | Status: AC

## 2019-04-01 NOTE — Progress Notes (Signed)
Wet prep reviewed-+BV, informed Rx will be sent to pharmacy; Depo adm. L. delt-well tolerated Sharlette Dense, RN

## 2019-04-01 NOTE — Progress Notes (Signed)
Pt wants to be checked for BV and yeast. Also wants depo today; last depo 12/15/2018; 15.2 weeks post depo today. Per [facility] records, last physical was 01/01/2018, PAP was due 04/2018, CBE due 2020; 04/14/2015 PAP was NIL.

## 2019-04-01 NOTE — Progress Notes (Signed)
   [facility]  Subjective:  Daisy Torres is a 24 y.o. SBF nullip being seen today for DMPA, pap, c/o malodor and white discharge x "few weeks"  Chief Complaint  Patient presents with  . Gynecologic Exam    c/o possible BV or yeast infection  . Contraception    depo (in arm)    HPI Last DMPA 12/15/18, last PE 01/01/18.  Last pap 04/14/15 wnl (pap due 04/2018).  Last sex 03/30/19 without condoms; with current partner x 3 years.  LMP ? With DMPA.   Does the patient have a current or past history of drug use? No   No components found for: HCV]   Health Maintenance Due  Topic Date Due  . TETANUS/TDAP  10/04/2012  . PAP-Cervical Cytology Screening  04/13/2018  . PAP SMEAR-Modifier  04/13/2018  . INFLUENZA VACCINE  10/11/2018    ROS  The following portions of the patient's history were reviewed and updated as appropriate: allergies, current medications, past family history, past medical history, past social history, past surgical history and problem list. Problem list updated.   See flowsheet for other program required questions.  Objective:   Vitals:   04/01/19 0854  BP: 114/80  Weight: 192 lb (87.1 kg)  Height: 5' 4.5" (1.638 m)    Physical Exam HENT:     Head: Normocephalic.  Pulmonary:     Effort: Pulmonary effort is normal.  Abdominal:     Palpations: Abdomen is soft. There is no mass.     Tenderness: There is no abdominal tenderness. There is no guarding.  Genitourinary:    General: Normal vulva.     Exam position: Lithotomy position.     Vagina: Vaginal discharge (thick white malodorous, ph>4.5) and erythema (vagina erythematous) present.     Cervix: Friability (friable to pap) present.     Uterus: Normal.      Adnexa: Right adnexa normal and left adnexa normal.     Rectum: Normal.  Skin:    General: Skin is warm and dry.  Neurological:     Mental Status: She is alert.        Assessment and Plan:  TENIA GOH is a 24 y.o. female presenting to the [facility] Department for a Women's Health problem visit  1. Family planning Treat wet mount per standing orders Immunization nurse consult - WET PREP FOR TRICH, YEAST, CLUE - Chlamydia/Gonorrhea [facility] - IGP, rfx Aptima HPV ASCU  2. Encounter for surveillance of injectable contraceptive May have DMPA 150 mg IM q 11-13 wks x 2 Needs physical and CBE that was due 2020      Return for 11-13 wk DMPA.  No future appointments.  Alberteen Spindle, CNM

## 2019-04-03 LAB — IGP, RFX APTIMA HPV ASCU: PAP Smear Comment: 0

## 2019-04-16 ENCOUNTER — Other Ambulatory Visit: Payer: Self-pay

## 2019-04-16 ENCOUNTER — Ambulatory Visit: Payer: Self-pay | Admitting: Physician Assistant

## 2019-04-16 ENCOUNTER — Encounter: Payer: Self-pay | Admitting: Physician Assistant

## 2019-04-16 DIAGNOSIS — B3731 Acute candidiasis of vulva and vagina: Secondary | ICD-10-CM

## 2019-04-16 DIAGNOSIS — B373 Candidiasis of vulva and vagina: Secondary | ICD-10-CM

## 2019-04-16 DIAGNOSIS — Z113 Encounter for screening for infections with a predominantly sexual mode of transmission: Secondary | ICD-10-CM

## 2019-04-16 LAB — WET PREP FOR TRICH, YEAST, CLUE: Trichomonas Exam: NEGATIVE

## 2019-04-16 MED ORDER — CLOTRIMAZOLE 1 % VA CREA: 1.0000 | TOPICAL_CREAM | Freq: Every day | VAGINAL | 0 refills | Status: AC

## 2019-04-16 NOTE — Progress Notes (Signed)
S:  Patient into clinic stating that she thinks that her BV is back.  States that she was here about 10 days ago and took all of the medicine for BV and is now having a white discharge with itching.  Denies any changes to her history from 04/01/2019 visit.  Denies any sexual activity since prior to last visit. O: WDWN female in NAD, A&Ox3, pleasant and cooperative.  Patient desired to self-collect wet mount.  Wet mount= many yeast, negative clue, amine and trich. A/P:  1.  Yeast needs treatment. 2.  Reviewed with patient history from last visit and patient denies any changes. 3.  Counseled that likely yeast is due to recent antibiotic use. 4.  Clotrimazole 1% vaginal cream use 1 app qhs for 7 days at bedtime. 5.  No sex for 7 days. 6.  Rec condoms with all sex. 7.  RTC prn.

## 2019-06-04 ENCOUNTER — Ambulatory Visit: Payer: Medicaid Other

## 2020-03-16 ENCOUNTER — Ambulatory Visit: Payer: Medicaid Other

## 2020-06-28 ENCOUNTER — Telehealth: Payer: Self-pay | Admitting: Licensed Clinical Social Worker

## 2020-06-28 NOTE — Telephone Encounter (Signed)
Pt. Left vm for LCSW requesting to reengage in services.

## 2020-07-01 ENCOUNTER — Ambulatory Visit: Payer: Medicaid Other

## 2020-07-11 ENCOUNTER — Ambulatory Visit (LOCAL_COMMUNITY_HEALTH_CENTER): Payer: Medicaid Other | Admitting: Family Medicine

## 2020-07-11 ENCOUNTER — Other Ambulatory Visit: Payer: Self-pay

## 2020-07-11 ENCOUNTER — Encounter: Payer: Self-pay | Admitting: Advanced Practice Midwife

## 2020-07-11 VITALS — BP 113/76 | Ht 65.0 in | Wt 166.0 lb

## 2020-07-11 DIAGNOSIS — Z30013 Encounter for initial prescription of injectable contraceptive: Secondary | ICD-10-CM

## 2020-07-11 DIAGNOSIS — Z3202 Encounter for pregnancy test, result negative: Secondary | ICD-10-CM | POA: Diagnosis not present

## 2020-07-11 DIAGNOSIS — Z3009 Encounter for other general counseling and advice on contraception: Secondary | ICD-10-CM | POA: Diagnosis not present

## 2020-07-11 DIAGNOSIS — Z113 Encounter for screening for infections with a predominantly sexual mode of transmission: Secondary | ICD-10-CM

## 2020-07-11 LAB — WET PREP FOR TRICH, YEAST, CLUE
Trichomonas Exam: NEGATIVE
Yeast Exam: NEGATIVE

## 2020-07-11 LAB — PREGNANCY, URINE: Preg Test, Ur: NEGATIVE

## 2020-07-11 MED ORDER — MEDROXYPROGESTERONE ACETATE 150 MG/ML IM SUSP
150.0000 mg | INTRAMUSCULAR | Status: AC
  Administered 2020-07-11 – 2021-02-15 (×2): 150 mg via INTRAMUSCULAR

## 2020-07-11 NOTE — Progress Notes (Signed)
PT negative, wet mount reviewed, no treatment indicated. Depo consent signed, depo given, left deltoid, tolerated well, next Depo card given.Burt Knack, RN

## 2020-07-11 NOTE — Progress Notes (Signed)
Family Planning Visit- Repeat Yearly Visit  Subjective:  Daisy Torres is a 25 y.o. G0P0000  being seen today for an annual wellness visit and to discuss contraception options.   The patient is currently using None for pregnancy prevention. Patient does not want a pregnancy in the next year. Patient has the following medical problems: has Adjustment disorder with mixed anxiety and depressed mood and Overweight on their problem list.  Chief Complaint  Patient presents with  . Annual Exam  . Contraception  . Exposure to STD    Patient reports here for physical, STI check and start BC.   Patient denies any questions or concerns   See flowsheet for other program required questions.   Body mass index is 27.62 kg/m. - Patient is eligible for diabetes screening based on BMI and age >7?  not applicable HA1C ordered? not applicable  Patient reports 2 of partners in last year. Desires STI screening?  Yes   Has patient been screened once for HCV in the past?  No  No results found for: HCVAB  Does the patient have current of drug use, have a partner with drug use, and/or has been incarcerated since last result? No  If yes-- Screen for HCV through [facility]   Does the patient meet criteria for HBV testing? No  Criteria:  -Household, sexual or needle sharing contact with HBV -History of drug use -HIV positive -Those with known Hep C   Health Maintenance Due  Topic Date Due  . Hepatitis C Screening  Never done  . COVID-19 Vaccine (1) Never done  . HPV VACCINES (1 - 2-dose series) Never done  . TETANUS/TDAP  Never done  . PAP-Cervical Cytology Screening  04/13/2018    Review of Systems  Constitutional: Negative for chills, fever, malaise/fatigue and weight loss.  HENT: Negative for congestion, hearing loss and sore throat.   Eyes: Negative for blurred vision, double vision and photophobia.  Respiratory: Negative for shortness of breath.   Cardiovascular: Negative for  chest pain.  Gastrointestinal: Negative for abdominal pain, blood in stool, constipation, diarrhea, heartburn, nausea and vomiting.  Genitourinary: Negative for dysuria and frequency.  Musculoskeletal: Negative for back pain, joint pain and neck pain.  Skin: Negative for itching and rash.  Neurological: Negative for dizziness, weakness and headaches.  Endo/Heme/Allergies: Does not bruise/bleed easily.  Psychiatric/Behavioral: Negative for depression, substance abuse and suicidal ideas.    The following portions of the patient's history were reviewed and updated as appropriate: allergies, current medications, past family history, past medical history, past social history, past surgical history and problem list. Problem list updated.  Objective:   Vitals:   07/11/20 1544  BP: 113/76  Weight: 166 lb (75.3 kg)  Height: 5\' 5"  (1.651 m)    Physical Exam Vitals and nursing note reviewed.  Constitutional:      Appearance: Normal appearance.  HENT:     Head: Normocephalic and atraumatic.     Mouth/Throat:     Mouth: Mucous membranes are moist.     Pharynx: No oropharyngeal exudate or posterior oropharyngeal erythema.  Eyes:     General: No scleral icterus. Neck:     Thyroid: No thyroid mass, thyromegaly or thyroid tenderness.  Cardiovascular:     Rate and Rhythm: Normal rate.     Pulses: Normal pulses.  Pulmonary:     Effort: Pulmonary effort is normal.  Abdominal:     General: Abdomen is flat. Bowel sounds are normal.  Palpations: Abdomen is soft.  Genitourinary:    Comments: Deferred, patient self collected Musculoskeletal:        General: Normal range of motion.  Skin:    General: Skin is warm and dry.  Neurological:     General: No focal deficit present.     Mental Status: She is alert.       Assessment and Plan:  Daisy Torres is a 25 y.o. female G0P0000 presenting to the [facility] Department for an yearly wellness and contraception visit   1.  Family planning  Contraception counseling: Reviewed all forms of birth control options in the tiered based approach. available including abstinence; over the counter/barrier methods; hormonal contraceptive medication including pill, patch, ring, injection,contraceptive implant, ECP; hormonal and nonhormonal IUDs; permanent sterilization options including vasectomy and the various tubal sterilization modalities. Risks, benefits, and typical effectiveness rates were reviewed.  Questions were answered.  Written information was also given to the patient to review.  Patient desires depo provera, this was prescribed for patient. She will follow up in 11-13 weeks  for surveillance.  She was told to call with any further questions, or with any concerns about this method of contraception.  Emphasized use of condoms 100% of the time for STI prevention.  Patient was offered ECP based on LMP.  - HIV [place] [facility] - Syphilis Serology, [place] [facility] - Pregnancy, urine  2. Screening examination for venereal disease  - Chlamydia/Gonorrhea [place] [facility] - WET PREP FOR TRICH, YEAST, CLUE - Pregnancy, urine - Chlamydia/Gonorrhea  Lab  3. Encounter for initial prescription of injectable contraceptive  DMPA IM  every 11-13 weeks x 1 year  - medroxyPROGESTERone (DEPO-PROVERA) injection 150 mg     Return in about 3 months (around 10/11/2020) for depo.  No future appointments.  Wendi Snipes, FNP

## 2020-07-11 NOTE — Progress Notes (Signed)
Patient here for PE , STD testing and to discuss BCM. Has used Depo in the past and would like to discuss other options. Sent to lab for blood work before seen by provider due to clinic running behind.Burt Knack, RN

## 2020-07-18 LAB — HM HIV SCREENING LAB: HM HIV Screening: NEGATIVE

## 2020-08-01 ENCOUNTER — Telehealth: Payer: Self-pay | Admitting: Licensed Clinical Social Worker

## 2020-08-01 NOTE — Telephone Encounter (Signed)
Patient left vm today requesting a call back.

## 2020-08-15 ENCOUNTER — Encounter: Payer: Self-pay | Admitting: Licensed Clinical Social Worker

## 2020-08-15 ENCOUNTER — Ambulatory Visit: Payer: Medicaid Other | Admitting: Licensed Clinical Social Worker

## 2020-08-15 DIAGNOSIS — F411 Generalized anxiety disorder: Secondary | ICD-10-CM
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
## 2020-08-19 ENCOUNTER — Ambulatory Visit: Payer: Medicaid Other

## 2020-08-22 ENCOUNTER — Ambulatory Visit: Payer: Medicaid Other | Admitting: Licensed Clinical Social Worker

## 2020-08-22 DIAGNOSIS — F411 Generalized anxiety disorder: Secondary | ICD-10-CM

## 2020-08-22 NOTE — Progress Notes (Signed)
Counselor/Therapist Progress Note  Patient ID: Daisy Torres, MRN: [id number],    Date: 08/22/2020  Time Spent: 45 minutes   Treatment Type: Psychotherapy  Reported Symptoms: Mild anxiety, anxious thought patterns   Mental Status Exam:  Appearance:   Casual     Behavior:  Appropriate and Sharing  Motor:  Normal  Speech/Language:   Clear and Coherent and Normal Rate  Affect:  Appropriate and Full Range  Mood:  euthymic  Thought process:  goal directed  Thought content:    WNL  Sensory/Perceptual disturbances:    WNL  Orientation:  oriented to person, place, time/date, and situation  Attention:  Good  Concentration:  Good  Memory:  WNL  Fund of knowledge:   Good  Insight:    Good  Judgment:   Good  Impulse Control:  Good   Risk Assessment: Danger to Self:  No Self-injurious Behavior: No Danger to Others: No Duty to Warn:no Physical Aggression / Violence:No  Access to Firearms a concern: No  Gang Involvement:No   Subjective: Patient was engaged and cooperative throughout the session using time effectively to discuss thoughts, feelings, and treatment plan. Patient voices continued motivation for treatment and understanding of anxiety related issues. Patient is likely to benefit from future treatment because she is motivated to decrease symptoms and improve functioning.   Interventions: Cognitive Behavioral Therapy Established psychological safety. Checked in with patient and reviewed previous session, including assessment and goal of treatment. Reviewed CBTs. Explored patient's goal of treatment and worked collaboratively to develop CBT treatment plan. Encouraged patient to take note of when relationship issues arise and when things are good. Provided support through active listening, validation of feelings, and highlighted patient's strengths.  Diagnosis:   ICD-10-CM   1. GAD (generalized anxiety disorder)  F41.1      Plan: Patient's goal of treatment is to be a better  person. To stop the toxic things like saying things out of anger.   Treatment Target: Understand the relationship between thoughts, emotions, and behaviors  Psychoeducation on CBT model   Teach the connection between thoughts, emotions, and behaviors  Treatment Target: Increase realistic balanced thinking -to learn how to replace thinking with thoughts that are more accurate or helpful Explore patient's thoughts, beliefs, automatic thoughts, assumptions  Identify and replace unhelpful thinking patterns (upsetting ideas, self-talk and mental images) Process distress and allow for emotional release  Questioning and challenging thoughts Cognitive reappraisal  Restructuring, Socratic questioning   Treatment Target: Increase coping skills to regulate emotions  Mindfulness practices  Deep breathing  Grounding techniques as necessary  Values clarification  Future Appointments  Date Time Provider Department Center  08/29/2020  1:00 PM Kathreen Cosier, LCSW [facility] None    Kathreen Cosier, LCSW

## 2020-08-29 ENCOUNTER — Ambulatory Visit: Payer: Medicaid Other | Admitting: Licensed Clinical Social Worker

## 2020-08-29 DIAGNOSIS — F411 Generalized anxiety disorder: Secondary | ICD-10-CM

## 2020-08-29 NOTE — Progress Notes (Signed)
Counselor/Therapist Progress Note  Patient ID: Daisy Torres, MRN: [id number],    Date: 08/29/2020  Time Spent: 30 minutes    Treatment Type: Psychotherapy  Reported Symptoms:  continued mood stability and managed anxiety  Mental Status Exam:  Appearance:   Casual     Behavior:  Appropriate and Sharing  Motor:  Normal  Speech/Language:   Clear and Coherent  Affect:  Appropriate and Congruent  Mood:  euthymic  Thought process:  goal directed  Thought content:    WNL  Sensory/Perceptual disturbances:    WNL  Orientation:  oriented to person, place, time/date, situation, and day of week  Attention:  Good  Concentration:  Good  Memory:  WNL  Fund of knowledge:   Good  Insight:    Good  Judgment:   Good  Impulse Control:  Good   Risk Assessment: Danger to Self:  No Self-injurious Behavior: No Danger to Others: No Duty to Warn:no Physical Aggression / Violence:No  Access to Firearms a concern: No  Gang Involvement:No   Subjective: Patient was engaged and cooperative throughout the session using time effectively to discuss thoughts and feelings. Patient voices continued stability and managed anxiety. She reports meeting her goal of treatment of managing anger and not saying things out of anger.  Patient voices she would like to reach out for appt.'s as needed.    Interventions: Cognitive Behavioral Therapy Established psychological safety. Checked in with patient regarding her week. Provided supportive space encouraging emotional release and processing of current psychosocial stressors, overall improvement in symptoms and no stressors. Highlighted self-care behaviors of daily exercise and healthy eating and encouraged patient to continue this practice. Also praised patient for use of radical acceptance and pausing before responding. Provided support through active listening, validation of feelings, and highlighted patient's strengths. Encouraged patient to return as needed.    Diagnosis:   ICD-10-CM   1. GAD (generalized anxiety disorder)  F41.1      Plan: Patient will schedule appt. As needed.   No future appointments.  Kathreen Cosier, LCSW

## 2020-09-28 ENCOUNTER — Ambulatory Visit: Payer: Medicaid Other

## 2020-10-28 ENCOUNTER — Ambulatory Visit: Payer: Medicaid Other

## 2020-11-23 ENCOUNTER — Ambulatory Visit (LOCAL_COMMUNITY_HEALTH_CENTER): Payer: Medicaid Other | Admitting: Advanced Practice Midwife

## 2020-11-23 ENCOUNTER — Other Ambulatory Visit: Payer: Self-pay

## 2020-11-23 VITALS — BP 124/78 | HR 71 | Temp 98.0°F | Resp 16 | Ht 65.0 in | Wt 164.0 lb

## 2020-11-23 DIAGNOSIS — Z3202 Encounter for pregnancy test, result negative: Secondary | ICD-10-CM

## 2020-11-23 DIAGNOSIS — Z3009 Encounter for other general counseling and advice on contraception: Secondary | ICD-10-CM | POA: Diagnosis not present

## 2020-11-23 DIAGNOSIS — Z30013 Encounter for initial prescription of injectable contraceptive: Secondary | ICD-10-CM | POA: Diagnosis not present

## 2020-11-23 LAB — PREGNANCY, URINE: Preg Test, Ur: NEGATIVE

## 2020-11-23 MED ORDER — MEDROXYPROGESTERONE ACETATE 150 MG/ML IM SUSP
150.0000 mg | Freq: Once | INTRAMUSCULAR | Status: AC
  Administered 2020-11-23: 150 mg via INTRAMUSCULAR

## 2020-11-23 MED ORDER — LEVONORGESTREL 1.5 MG PO TABS: 1.5000 mg | ORAL_TABLET | Freq: Once | ORAL | 0 refills | Status: AC

## 2020-11-23 NOTE — Progress Notes (Signed)
Patient is here for acute FP visit to restart depo.   Last physical exam was done 07/11/20. Last depo was given 07/11/20 as well. Patient is 19w 2d post depo. Last sex was 11/22/20 without a condom.   Pregnancy test today - negative.   Floy Sabina, RN

## 2020-11-23 NOTE — Progress Notes (Signed)
19w 2d weeks post depo. Seen by provider Hazle Coca, CNM today.   Voices no concerns. Depo given today per  order of E. Sciora, CNM given today following negative pregnancy test today.   Physical exam up to date done on 07/11/20.   Depo administered R. Deltoid tolerated well. Next Depo due 02/08/21. Depo consent signed today. Reminder card given to patient.    Given today to patient Plan B as well, last unprotected sex was 11/22/20. Patient ordered to take at-home pregnancy test in 2 weeks on 12/06/20, patient agrees.   Floy Sabina, RN

## 2020-11-23 NOTE — Progress Notes (Signed)
Contraception/Family Planning VISIT ENCOUNTER NOTE  Subjective:   Daisy Torres is a 25 y.o. SBF  nonsmoker G0P0000 female here for reproductive life counseling.  Desires DMPA restart for BCM.  Reports she does not want a pregnancy in the next year. Denies abnormal vaginal bleeding, discharge, pelvic pain, problems with intercourse or other gynecologic concerns.  Wants to reinitiate DMPA. Last DMPA5/2/22 (19 2/7). Last PE 07/11/20. Last pap 04/01/19 neg. Last sex yesterday without condom; with current partner since 02/2016; 1 partner in last 3 mo. Denies cigs, vaping, cigars.   Gynecologic History No LMP recorded (lmp unknown). Patient has had an injection. Contraception:  none  Health Maintenance Due  Topic Date Due   COVID-19 Vaccine (1) Never done   Hepatitis C Screening  Never done   TETANUS/TDAP  Never done   PAP-Cervical Cytology Screening  04/13/2018   INFLUENZA VACCINE  Never done     The following portions of the patient's history were reviewed and updated as appropriate: allergies, current medications, past family history, past medical history, past social history, past surgical history and problem list.  Review of Systems Pertinent items are noted in HPI.   Objective:  BP 124/78   Pulse 71   Temp 98 F (36.7 C) (Oral)   Resp 16   Ht 5\' 5"  (1.651 m)   Wt 164 lb (74.4 kg)   LMP  (LMP Unknown) Comment: No peiords while on depo  BMI 27.29 kg/m  Gen: well appearing, NAD HEENT: no scleral icterus CV: RR Lung: Normal WOB Ext: warm well perfused   Assessment and Plan:   Contraception counseling: Reviewed all forms of birth control options in the tiered based approach. available including abstinence; over the counter/barrier methods; hormonal contraceptive medication including pill, patch, ring, injection,contraceptive implant, ECP; hormonal and nonhormonal IUDs; permanent sterilization options including vasectomy and the various tubal sterilization modalities. Risks,  benefits, and typical effectiveness rates were reviewed.  Questions were answered.  Written information was also given to the patient to review.  Patient desires DMPA reinitiation, this was prescribed for patient. She will follow up in  11-13 wks for surveillance.  She was told to call with any further questions, or with any concerns about this method of contraception.  Emphasized use of condoms 100% of the time for STI prevention.  Patient was offered ECP. ECP was accepted by the patient. ECP counseling was given - see RN documentation  1. Family planning  - Pregnancy, urine  2. Encounter for initial prescription of injectable contraceptive May have DMPA 150 mg IM x1 per 07/11/20 order if PT neg today per pt request Pt desires Plan B today; pt understands she may be pregnant from yesterday and that Plan B is not 100% effective at preventing pregnancy Pt counseled to do PT 12/06/20 and call 12/08/20 if + - medroxyPROGESTERone (DEPO-PROVERA) injection 150 mg    Please refer to After Visit Summary for other counseling recommendations.   No follow-ups on file.  [place], CNM [facility] DEPARTMENT

## 2021-02-15 ENCOUNTER — Ambulatory Visit (LOCAL_COMMUNITY_HEALTH_CENTER): Payer: Medicaid Other

## 2021-02-15 ENCOUNTER — Other Ambulatory Visit: Payer: Self-pay

## 2021-02-15 VITALS — BP 112/79 | Ht 65.0 in | Wt 163.0 lb

## 2021-02-15 DIAGNOSIS — Z3042 Encounter for surveillance of injectable contraceptive: Secondary | ICD-10-CM

## 2021-02-15 DIAGNOSIS — Z3009 Encounter for other general counseling and advice on contraception: Secondary | ICD-10-CM

## 2021-02-15 DIAGNOSIS — Z30013 Encounter for initial prescription of injectable contraceptive: Secondary | ICD-10-CM | POA: Diagnosis not present

## 2021-02-15 NOTE — Progress Notes (Signed)
12 weeks 0 days post depo. Voices no concerns. Depo given today per order by Elveria Rising, FNP dated 07/11/2020. Tolerated well L delt. Has reminder for next depo, approx 05/03/2021. Jerel Shepherd, RN

## 2021-03-09 ENCOUNTER — Ambulatory Visit: Payer: Medicaid Other

## 2021-05-17 ENCOUNTER — Ambulatory Visit: Payer: Medicaid Other

## 2021-05-17 ENCOUNTER — Other Ambulatory Visit: Payer: Self-pay

## 2021-05-17 ENCOUNTER — Ambulatory Visit (LOCAL_COMMUNITY_HEALTH_CENTER): Payer: Medicaid Other | Admitting: Advanced Practice Midwife

## 2021-05-17 VITALS — BP 122/83 | Wt 162.2 lb

## 2021-05-17 DIAGNOSIS — Z113 Encounter for screening for infections with a predominantly sexual mode of transmission: Secondary | ICD-10-CM

## 2021-05-17 DIAGNOSIS — Z3009 Encounter for other general counseling and advice on contraception: Secondary | ICD-10-CM

## 2021-05-17 DIAGNOSIS — Z309 Encounter for contraceptive management, unspecified: Secondary | ICD-10-CM | POA: Diagnosis not present

## 2021-05-17 DIAGNOSIS — Z3042 Encounter for surveillance of injectable contraceptive: Secondary | ICD-10-CM

## 2021-05-17 LAB — WET PREP FOR TRICH, YEAST, CLUE
Trichomonas Exam: NEGATIVE
Yeast Exam: NEGATIVE

## 2021-05-17 LAB — HM HEPATITIS C SCREENING LAB: HM Hepatitis Screen: NEGATIVE

## 2021-05-17 LAB — HM HIV SCREENING LAB: HM HIV Screening: NEGATIVE

## 2021-05-17 MED ORDER — MEDROXYPROGESTERONE ACETATE 150 MG/ML IM SUSP
150.0000 mg | Freq: Once | INTRAMUSCULAR | Status: AC
  Administered 2021-05-17: 150 mg via INTRAMUSCULAR

## 2021-05-17 NOTE — Progress Notes (Signed)
[facility] Department ? ?STI clinic/screening visit ?[address] ?[place] [address] ?[phone] ? ?Subjective:  ?Daisy Torres is a 26 y.o. SBF nullip female being seen today for an STI screening visit. The patient reports they do not have symptoms.  Patient reports that they do not desire a pregnancy in the next year.   They reported they are not interested in discussing contraception today.   ? ?No LMP recorded. Patient has had an injection. ? ? ?Patient has the following medical conditions:   ?Patient Active Problem List  ? Diagnosis Date Noted  ? Adjustment disorder with mixed anxiety and depressed mood 12/19/2016  ? Overweight BMI=27.2 12/18/2016  ? ? ?Chief Complaint  ?Patient presents with  ? Contraception  ? SEXUALLY TRANSMITTED DISEASE  ? ? ?HPI ? ?Patient reports here for STD screen and DMPA. States last sex condom broke on 05/09/21; first sex with this partner; 1 partner in last 3 mo. Last DMPA 02/15/21 (12 6/7). Happy with DMPA. Last PE 07/11/20. ? ?Last HIV test per patient/review of record was 07/18/20 ?Patient reports last pap was 04/01/19 neg ? ?Screening for MPX risk: ?Does the patient have an unexplained rash? No ?Is the patient MSM? No ?Does the patient endorse multiple sex partners or anonymous sex partners? No ?Did the patient have close or sexual contact with a person diagnosed with MPX? No ?Has the patient traveled outside the [place] where MPX is endemic? No ?Is there a high clinical suspicion for MPX-- evidenced by one of the following No ? -Unlikely to be chickenpox ? -Lymphadenopathy ? -Rash that present in same phase of evolution on any given body part ?See flowsheet for further details and programmatic requirements.  ? ? ?The following portions of the patient's history were reviewed and updated as appropriate: allergies, current medications, past medical history, past social history, past surgical history and problem list. ? ?Objective:  ? ?Vitals:  ? 05/17/21 0843   ?BP: 122/83  ?Weight: 162 lb 3.2 oz (73.6 kg)  ? ? ?Physical Exam ?Vitals and nursing note reviewed.  ?Constitutional:   ?   Appearance: Normal appearance. She is normal weight.  ?HENT:  ?   Head: Normocephalic and atraumatic.  ?   Mouth/Throat:  ?   Mouth: Mucous membranes are moist.  ?   Pharynx: Oropharynx is clear. No oropharyngeal exudate or posterior oropharyngeal erythema.  ?Eyes:  ?   Conjunctiva/sclera: Conjunctivae normal.  ?Pulmonary:  ?   Effort: Pulmonary effort is normal.  ?Abdominal:  ?   General: Abdomen is flat.  ?   Palpations: Abdomen is soft. There is no mass.  ?   Tenderness: There is no abdominal tenderness. There is no rebound.  ?   Comments: Soft without masses or tenderness, good tone  ?Genitourinary: ?   General: Normal vulva.  ?   Exam position: Lithotomy position.  ?   Pubic Area: No rash or pubic lice.   ?   Labia:     ?   Right: No rash or lesion.     ?   Left: No rash or lesion.   ?   Vagina: Vaginal discharge (white creamy leukorrhea, ph<4.5) present. No erythema, bleeding or lesions.  ?   Cervix: Normal.  ?   Uterus: Normal.   ?   Adnexa: Right adnexa normal and left adnexa normal.  ?   Rectum: Normal.  ?Lymphadenopathy:  ?   Head:  ?   Right side of head: No  preauricular or posterior auricular adenopathy.  ?   Left side of head: No preauricular or posterior auricular adenopathy.  ?   Cervical: No cervical adenopathy.  ?   Right cervical: No superficial, deep or posterior cervical adenopathy. ?   Left cervical: No superficial, deep or posterior cervical adenopathy.  ?   Upper Body:  ?   Right upper body: No supraclavicular or axillary adenopathy.  ?   Left upper body: No supraclavicular or axillary adenopathy.  ?   Lower Body: No right inguinal adenopathy. No left inguinal adenopathy.  ?Skin: ?   General: Skin is warm and dry.  ?   Findings: No rash.  ?Neurological:  ?   Mental Status: She is alert and oriented to person, place, and time.  ? ? ? ?Assessment and Plan:  ?YONA KOSEK is a 26 y.o. female presenting to the [facility] Department for STI screening ? ?1. Screening examination for venereal disease ?Treat wet mount per standing orders ?Immunization nurse consult ?- WET PREP FOR TRICH, YEAST, CLUE ?- Chlamydia/Gonorrhea [facility] ?- HIV/HCV [place] ?- Syphilis Serology, [facility] ? ?2. Family planning ?May have DMPA 150 mg IM per 07/11/20 standing order ?Needs physical 07/2021 ?- medroxyPROGESTERone (DEPO-PROVERA) injection 150 mg ? ?3. Encounter for surveillance of injectable contraceptive ?May have DMPA 150 mg IM per 07/11/20 standing order ? ? ? ? ?No follow-ups on file. ? ?No future appointments. ? ?Alberteen Spindle, CNM ?

## 2021-05-17 NOTE — Progress Notes (Signed)
Patient here for STD testing and depo shot. Depo charted. Given in R deltoid. Appt reminder card offered to patient. Condoms given. Wet prep reviewed, no tx per standing orders.  ?

## 2021-08-10 ENCOUNTER — Ambulatory Visit: Payer: Medicaid Other

## 2022-05-21 ENCOUNTER — Other Ambulatory Visit: Payer: Self-pay | Admitting: Family Medicine

## 2022-05-21 ENCOUNTER — Ambulatory Visit (LOCAL_COMMUNITY_HEALTH_CENTER): Payer: Medicaid Other

## 2022-05-21 VITALS — BP 122/83 | Ht 65.0 in | Wt 145.5 lb

## 2022-05-21 DIAGNOSIS — Z30012 Encounter for prescription of emergency contraception: Secondary | ICD-10-CM | POA: Diagnosis not present

## 2022-05-21 DIAGNOSIS — Z309 Encounter for contraceptive management, unspecified: Secondary | ICD-10-CM

## 2022-05-21 DIAGNOSIS — Z3009 Encounter for other general counseling and advice on contraception: Secondary | ICD-10-CM

## 2022-05-21 MED ORDER — LEVONORGESTREL 1.5 MG PO TABS: 1.5000 mg | ORAL_TABLET | Freq: Once | ORAL | 0 refills | Status: AC

## 2022-05-21 NOTE — Progress Notes (Signed)
In nurse clinic requesting ECP. Unprotected sex 05/19/2022 and before then on 04/26/2022. LMP 04/26/2022 lasting 3 weeks. Depo stopped approx 1 yr ago and LMP on 04/26/22 was first period since stopping depo. Pt has medicaid. Also interested in [facility] appt to discuss bc options.   Consult Art Buff, FNP who orders Plan B and sends E Rx to pt pharmacy ([facility] on [address]).   RN explained this to pt. ECP consent and info sheet reviewed and consent signed. Questions answered and reports understanding. Pt sent to clerk and  [facility] appt scheduled for 05/24/2022. Josie Saunders, RN

## 2022-05-24 ENCOUNTER — Encounter: Payer: Self-pay | Admitting: Family Medicine

## 2022-05-24 ENCOUNTER — Ambulatory Visit (LOCAL_COMMUNITY_HEALTH_CENTER): Payer: Medicaid Other | Admitting: Family Medicine

## 2022-05-24 VITALS — BP 118/81 | HR 83 | Ht 65.0 in | Wt 147.5 lb

## 2022-05-24 DIAGNOSIS — Z113 Encounter for screening for infections with a predominantly sexual mode of transmission: Secondary | ICD-10-CM

## 2022-05-24 DIAGNOSIS — Z309 Encounter for contraceptive management, unspecified: Secondary | ICD-10-CM

## 2022-05-24 DIAGNOSIS — Z01419 Encounter for gynecological examination (general) (routine) without abnormal findings: Secondary | ICD-10-CM

## 2022-05-24 DIAGNOSIS — Z30430 Encounter for insertion of intrauterine contraceptive device: Secondary | ICD-10-CM | POA: Diagnosis not present

## 2022-05-24 DIAGNOSIS — B3731 Acute candidiasis of vulva and vagina: Secondary | ICD-10-CM

## 2022-05-24 DIAGNOSIS — Z3009 Encounter for other general counseling and advice on contraception: Secondary | ICD-10-CM

## 2022-05-24 DIAGNOSIS — Z3202 Encounter for pregnancy test, result negative: Secondary | ICD-10-CM

## 2022-05-24 LAB — WET PREP FOR TRICH, YEAST, CLUE: Trichomonas Exam: NEGATIVE

## 2022-05-24 LAB — PREGNANCY, URINE: Preg Test, Ur: NEGATIVE

## 2022-05-24 MED ORDER — LEVONORGESTREL 20.1 MCG/DAY IU IUD
1.0000 | INTRAUTERINE_SYSTEM | Freq: Once | INTRAUTERINE | Status: AC
  Administered 2022-05-24: 1 via INTRAUTERINE

## 2022-05-24 MED ORDER — LEVONORGESTREL 1.5 MG PO TABS: 1.5000 mg | ORAL_TABLET | Freq: Once | ORAL | 0 refills | Status: DC

## 2022-05-24 MED ORDER — CLOTRIMAZOLE-BETAMETHASONE 1-0.05 % EX CREA: 1.0000 | TOPICAL_CREAM | Freq: Every day | CUTANEOUS | 0 refills | Status: AC

## 2022-05-24 NOTE — Progress Notes (Signed)
Pt appointment for PE, Pap, and BC counseling. Seen by FNP Lowella Petties. Birth control counseling provided by provider. IUD placed. RN provided mental health education and support. Resources provided. Family planning packet given and reviewed. Initial lab results positive for yeast. RN dispensed Clotrimazole with instructions.

## 2022-05-24 NOTE — Progress Notes (Signed)
[facility] Number: [phone]  Family Planning Visit- Repeat Yearly Visit  Subjective:  Daisy Torres is a 27 y.o. G0P0000  being seen today for an annual wellness visit and to discuss contraception options.   The patient is currently using No Method - No Contraceptive Precautions for pregnancy prevention. Patient does not want a pregnancy in the next year.    report they are looking for a method that provides High efficacy at preventing pregnancy   Patient has the following medical problems: has Adjustment disorder with mixed anxiety and depressed mood and Overweight BMI=27.2 on their problem list.  Chief Complaint  Patient presents with   Contraception    Physical pap std screening  and discuss birth control options     Patient reports to clinic for STI Testing and IUD with PE and pap test  Patient denies concerns about self. Pt had a HA recently- but states this is not common for her. She attributes this to her not eating that day. She also states she got her period about 1 month ago and at that time she had some swelling in her feet and it went away.    See flowsheet for other program required questions.   Body mass index is 24.55 kg/m. - Patient is eligible for diabetes screening based on BMI> 25 and age >35?  no HA1C ordered? not applicable  Patient reports 1 of partners in last year. Desires STI screening?  Yes   Has patient been screened once for HCV in the past?  No  No results found for: "HCVAB"  Does the patient have current of drug use, have a partner with drug use, and/or has been incarcerated since last result? No  If yes-- Screen for HCV through [facility]   Does the patient meet criteria for HBV testing? No  Criteria:  -Household, sexual or needle sharing contact with HBV -History of drug use -HIV positive -Those with known Hep C   Health Maintenance Due  Topic Date  Due   DTaP/Tdap/Td (1 - Tdap) Never done   PAP-Cervical Cytology Screening  04/13/2018   INFLUENZA VACCINE  Never done   COVID-19 Vaccine (3 - 2023-24 season) 11/10/2021   PAP SMEAR-Modifier  03/31/2022    Review of Systems  Constitutional:  Negative for weight loss.  Eyes:  Negative for blurred vision.  Respiratory:  Negative for cough and shortness of breath.   Cardiovascular:  Negative for claudication.  Gastrointestinal:  Negative for nausea.  Genitourinary:  Negative for dysuria and frequency.  Skin:  Negative for rash.  Neurological:  Positive for headaches.  Endo/Heme/Allergies:  Does not bruise/bleed easily.    The following portions of the patient's history were reviewed and updated as appropriate: allergies, current medications, past family history, past medical history, past social history, past surgical history and problem list. Problem list updated.  Objective:   Vitals:   05/24/22 1031  BP: 118/81  Pulse: 83  Weight: 147 lb 8 oz (66.9 kg)  Height: '5\' 5"'$  (1.651 m)    Physical Exam Vitals and nursing note reviewed.  Constitutional:      Appearance: Normal appearance.  HENT:     Head: Normocephalic and atraumatic.     Mouth/Throat:     Mouth: Mucous membranes are moist.     Pharynx: Oropharynx is clear. No oropharyngeal exudate or posterior oropharyngeal erythema.  Pulmonary:     Effort: Pulmonary effort is normal.  Abdominal:     General: Abdomen is flat.     Palpations: There is no mass.     Tenderness: There is no abdominal tenderness. There is no rebound.  Genitourinary:    General: Normal vulva.     Exam position: Lithotomy position.     Pubic Area: No rash or pubic lice.      Tanner stage (genital): 5.     Labia:        Right: No rash or lesion.        Left: No rash or lesion.      Vagina: Vaginal discharge present. No erythema, bleeding or lesions.     Cervix: No cervical motion tenderness, discharge, friability, lesion or erythema.      Uterus: Normal.      Adnexa: Right adnexa normal and left adnexa normal.     Rectum: Normal.     Comments: pH = 4  Thick white adherent discharge in vagina Lymphadenopathy:     Head:     Right side of head: No preauricular or posterior auricular adenopathy.     Left side of head: No preauricular or posterior auricular adenopathy.     Cervical: No cervical adenopathy.     Upper Body:     Right upper body: No supraclavicular, axillary or epitrochlear adenopathy.     Left upper body: No supraclavicular, axillary or epitrochlear adenopathy.     Lower Body: No right inguinal adenopathy. No left inguinal adenopathy.  Skin:    General: Skin is warm and dry.     Findings: No rash.  Neurological:     Mental Status: She is alert and oriented to person, place, and time.  Psychiatric:        Mood and Affect: Mood normal.        Behavior: Behavior normal.     Assessment and Plan:  Daisy Torres is a 27 y.o. female [place] presenting to the [facility] Department for an yearly wellness and contraception visit  Contraception counseling: Reviewed options based on patient desire and reproductive life plan. Patient is interested in IUD or IUS. This was provided to the patient today.   Risks, benefits, and typical effectiveness rates were reviewed.  Questions were answered.  Written information was also given to the patient to review.    The patient will follow up in  4 weeks for surveillance.  The patient was told to call with any further questions, or with any concerns about this method of contraception.  Emphasized use of condoms 100% of the time for STI prevention.  1. Well woman exam with routine gynecological exam -CBE declined today- no concerns -Pt reports 1 HA in the last month, and some swelling her her feet at her last period. Pt states she had not had a period in > 1 year due to  - IGP, rfx Aptima HPV ASCU  2. Encounter for IUD insertion Patient presented to [facility] for  IUD insertion. Her GC/CT screening was found to be up to date and using WHO criteria we can be reasonably certain she is not pregnant or a pregnancy test was obtained which was Urine pregnancy test  today was Negative.  See Flowsheet for IUD check list  IUD Insertion Procedure Note Patient identified, informed consent performed, consent signed.   Discussed risks of irregular bleeding, cramping, infection, malpositioning or misplacement of the IUD outside the uterus which may require further procedure such as laparoscopy. Time out was performed.  Speculum placed in the vagina.  Cervix visualized.  Cleaned with Betadine x 2.  Grasped anteriorly with a single tooth tenaculum.  Uterus sounded to 7 cm.  But deployed early- needed second device and inserted to 8-9 cm. IUD placed per manufacturer's recommendations.  Strings trimmed to 3 cm. Tenaculum was removed, good hemostasis noted.  Patient tolerated procedure well.   Patient was given post-procedure instructions- both agency handout and verbally by provider.  She was advised to have backup contraception for one week.  Patient was also asked to check IUD strings periodically or follow up in 4 weeks for IUD check.   3. Family planning Patient was assessed for need for ECP. Patient was offered ECP based on Unprotected sex within past 72 hours.  Patient is within 2 days of unprotected sex. Patient was offered ECP. Reviewed options and patient desired LNG -IUD   - Pregnancy, urine  4. Screening for venereal disease  - Chlamydia/Gonorrhea [place], [place]      Return in about 4 weeks (around 06/21/2022), or if symptoms worsen or fail to improve, for IUD string check.  No future appointments.  Sharlet Salina, [place]

## 2022-05-28 ENCOUNTER — Telehealth: Payer: Self-pay | Admitting: Family Medicine

## 2022-05-28 DIAGNOSIS — F439 Reaction to severe stress, unspecified: Secondary | ICD-10-CM | POA: Diagnosis not present

## 2022-05-28 NOTE — Telephone Encounter (Signed)
Phone call to patient to follow up from IUD insertion. Patient states she is doing well now. Has occasional cramps "here and there" but otherwise feels ok. Denies fever or severe pain. Counseled to go to the hospital with severe pain. Verbalized understanding.  [facility] FNP-C

## 2022-05-29 LAB — IGP, RFX APTIMA HPV ASCU: PAP Smear Comment: 0

## 2022-05-30 ENCOUNTER — Telehealth: Payer: Self-pay | Admitting: Family Medicine

## 2022-05-30 NOTE — Telephone Encounter (Signed)
Pt states that she was to have treatment for a week but was only given meds for 1 day. Please call her back asap. Thanks

## 2022-05-31 NOTE — Telephone Encounter (Signed)
Pt has thrown the Clotrimazole applicator in the trash and wanted to know if we had any additional ones.  I informed the pt that we did not have any additional applicators, however, she could contact her Pharmacy and ask if they had any additional ones.

## 2022-06-12 DIAGNOSIS — F439 Reaction to severe stress, unspecified: Secondary | ICD-10-CM | POA: Diagnosis not present

## 2022-06-19 DIAGNOSIS — F439 Reaction to severe stress, unspecified: Secondary | ICD-10-CM | POA: Diagnosis not present

## 2022-06-27 DIAGNOSIS — F439 Reaction to severe stress, unspecified: Secondary | ICD-10-CM | POA: Diagnosis not present

## 2022-07-10 DIAGNOSIS — F439 Reaction to severe stress, unspecified: Secondary | ICD-10-CM | POA: Diagnosis not present

## 2022-07-19 DIAGNOSIS — F439 Reaction to severe stress, unspecified: Secondary | ICD-10-CM | POA: Diagnosis not present

## 2022-08-21 DIAGNOSIS — F439 Reaction to severe stress, unspecified: Secondary | ICD-10-CM | POA: Diagnosis not present

## 2022-08-28 DIAGNOSIS — F439 Reaction to severe stress, unspecified: Secondary | ICD-10-CM | POA: Diagnosis not present

## 2022-09-06 DIAGNOSIS — F439 Reaction to severe stress, unspecified: Secondary | ICD-10-CM | POA: Diagnosis not present

## 2022-09-18 DIAGNOSIS — F439 Reaction to severe stress, unspecified: Secondary | ICD-10-CM | POA: Diagnosis not present

## 2022-09-20 ENCOUNTER — Ambulatory Visit: Payer: 59

## 2022-09-25 DIAGNOSIS — F439 Reaction to severe stress, unspecified: Secondary | ICD-10-CM | POA: Diagnosis not present

## 2022-10-02 DIAGNOSIS — F439 Reaction to severe stress, unspecified: Secondary | ICD-10-CM | POA: Diagnosis not present

## 2022-10-09 DIAGNOSIS — F439 Reaction to severe stress, unspecified: Secondary | ICD-10-CM | POA: Diagnosis not present

## 2022-10-17 DIAGNOSIS — F439 Reaction to severe stress, unspecified: Secondary | ICD-10-CM | POA: Diagnosis not present

## 2022-10-22 ENCOUNTER — Emergency Department: Payer: 59

## 2022-10-22 ENCOUNTER — Other Ambulatory Visit: Payer: Self-pay

## 2022-10-22 ENCOUNTER — Emergency Department
Admission: EM | Admit: 2022-10-22 | Discharge: 2022-10-22 | Disposition: A | Discharge status: Home / Self Care | Payer: 59 | Attending: Emergency Medicine | Admitting: Emergency Medicine

## 2022-10-22 DIAGNOSIS — R0602 Shortness of breath: Secondary | ICD-10-CM | POA: Diagnosis not present

## 2022-10-22 DIAGNOSIS — R0789 Other chest pain: Secondary | ICD-10-CM | POA: Diagnosis not present

## 2022-10-22 DIAGNOSIS — R079 Chest pain, unspecified: Secondary | ICD-10-CM | POA: Insufficient documentation

## 2022-10-22 DIAGNOSIS — R071 Chest pain on breathing: Secondary | ICD-10-CM | POA: Diagnosis not present

## 2022-10-22 LAB — CBC WITH DIFFERENTIAL/PLATELET
Abs Immature Granulocytes: 0.02 10*3/uL (ref 0.00–0.07)
Basophils Absolute: 0 10*3/uL (ref 0.0–0.1)
Basophils Relative: 0 %
Eosinophils Absolute: 0.1 10*3/uL (ref 0.0–0.5)
Eosinophils Relative: 2 %
HCT: 38.4 % (ref 36.0–46.0)
Hemoglobin: 13 g/dL (ref 12.0–15.0)
Immature Granulocytes: 0 %
Lymphocytes Relative: 52 %
Lymphs Abs: 3.5 10*3/uL (ref 0.7–4.0)
MCH: 30.3 pg (ref 26.0–34.0)
MCHC: 33.9 g/dL (ref 30.0–36.0)
MCV: 89.5 fL (ref 80.0–100.0)
Monocytes Absolute: 0.5 10*3/uL (ref 0.1–1.0)
Monocytes Relative: 7 %
Neutro Abs: 2.6 10*3/uL (ref 1.7–7.7)
Neutrophils Relative %: 39 %
Platelets: 219 10*3/uL (ref 150–400)
RBC: 4.29 MIL/uL (ref 3.87–5.11)
RDW: 12.2 % (ref 11.5–15.5)
WBC: 6.8 10*3/uL (ref 4.0–10.5)
nRBC: 0 % (ref 0.0–0.2)

## 2022-10-22 LAB — BASIC METABOLIC PANEL
Anion gap: 8 (ref 5–15)
BUN: 13 mg/dL (ref 6–20)
CO2: 23 mmol/L (ref 22–32)
Calcium: 9.1 mg/dL (ref 8.9–10.3)
Chloride: 110 mmol/L (ref 98–111)
Creatinine, Ser: 0.74 mg/dL (ref 0.44–1.00)
GFR, Estimated: >60 mL/min (ref >60)
Glucose, Bld: 96 mg/dL (ref 70–99)
Potassium: 3.2 mmol/L — ABNORMAL LOW (ref 3.5–5.1)
Sodium: 141 mmol/L (ref 135–145)

## 2022-10-22 LAB — TROPONIN I (HIGH SENSITIVITY)
Troponin I (High Sensitivity): 3 ng/L (ref <18)
Troponin I (High Sensitivity): 3 ng/L (ref <18)

## 2022-10-22 LAB — D-DIMER, QUANTITATIVE: D-Dimer, Quant: <0.27 ug/mL-FEU (ref 0.00–0.50)

## 2022-10-22 MED ORDER — ONDANSETRON HCL 4 MG/2ML IJ SOLN
4.0000 mg | INTRAMUSCULAR | Status: AC
  Administered 2022-10-22: 4 mg via INTRAVENOUS
  Filled 2022-10-22: qty 2

## 2022-10-22 MED ORDER — POTASSIUM CHLORIDE CRYS ER 20 MEQ PO TBCR
40.0000 meq | EXTENDED_RELEASE_TABLET | Freq: Once | ORAL | Status: AC
  Administered 2022-10-22: 40 meq via ORAL
  Filled 2022-10-22: qty 2

## 2022-10-22 MED ORDER — KETOROLAC TROMETHAMINE 30 MG/ML IJ SOLN
15.0000 mg | Freq: Once | INTRAMUSCULAR | Status: AC
  Administered 2022-10-22: 15 mg via INTRAVENOUS
  Filled 2022-10-22: qty 1

## 2022-10-22 NOTE — ED Provider Notes (Signed)
[facility] Provider Note    Event Date/Time   First MD Initiated Contact with Patient 10/22/22 [phone]     (approximate)   History   Chest Pain   HPI Daisy Torres is a 27 y.o. female who reports no chronic medical issues and presents for evaluation of intermittent chest pain for the last few weeks.  She said that it started only occurred once or twice a week.  However for the last few days it has been having much more often.  It typically occurs at night but recently it has been happening during the day as well.  It feels like a sharp pain in the middle upper part of her chest.  She denies associated symptoms like nausea, vomiting, and abdominal pain.  She does have some shortness of breath but she associates that with stress.  She has no history of heart problems or blood clots.  She has had no unilateral leg pain or swelling, no recent trips or immobilizations.  She uses birth control (IUD).  Of note she reports that the pain tonight work her up out of a sleep.     Physical Exam   Triage Vital Signs: ED Triage Vitals  Encounter Vitals Group     BP 10/22/22 0335 (!) 163/109     Systolic BP Percentile --      Diastolic BP Percentile --      Pulse Rate 10/22/22 0335 89     Resp 10/22/22 0335 20     Temp 10/22/22 0335 98.4 F (36.9 C)     Temp Source 10/22/22 0335 Oral     SpO2 10/22/22 0335 98 %     Weight 10/22/22 0333 65.8 kg (145 lb)     Height 10/22/22 0333 1.651 m (5\' 5" )     Head Circumference --      Peak Flow --      Pain Score 10/22/22 0333 10     Pain Loc --      Pain Education --      Exclude from Growth Chart --     Most recent vital signs: Vitals:   10/22/22 0335 10/22/22 0700  BP: (!) 163/109 130/84  Pulse: 89 79  Resp: 20   Temp: 98.4 F (36.9 C)   SpO2: 98% 100%    General: Awake, no distress.  CV:  Good peripheral perfusion.  Normal heart sounds, regular rate and rhythm. Resp:  Normal effort. Speaking easily and  comfortably, no accessory muscle usage nor intercostal retractions.  Lungs are clear to auscultation bilaterally. Abd:  No distention.  No tenderness to palpation.   ED Results / Procedures / Treatments   Labs (all labs ordered are listed, but only abnormal results are displayed) Labs Reviewed  BASIC METABOLIC PANEL - Abnormal; Notable for the following components:      Result Value   Potassium 3.2 (*)    All other components within normal limits  CBC WITH DIFFERENTIAL/PLATELET  D-DIMER, QUANTITATIVE  POC URINE PREG, ED  TROPONIN I (HIGH SENSITIVITY)  TROPONIN I (HIGH SENSITIVITY)     EKG  ED ECG REPORT I, Loleta Rose, the attending physician, personally viewed and interpreted this ECG.  Date: 10/22/2022 EKG Time: 3:34 AM Rate: 89 Rhythm: normal sinus rhythm with sinus arrhythmia QRS Axis: normal Intervals: normal ST/T Wave abnormalities: normal Narrative Interpretation: no evidence of acute ischemia    RADIOLOGY I viewed and interpreted the patient's two-view chest x-ray and I see no evidence  of emergent abnormality such as pneumothorax or pneumonia.  I also read the radiologist's report, which confirmed no acute findings.   PROCEDURES:  Critical Care performed: No  .1-3 Lead EKG Interpretation  Performed by: Loleta Rose, MD Authorized by: Loleta Rose, MD     Interpretation: normal     ECG rate:  88   ECG rate assessment: normal     Rhythm: sinus rhythm     Ectopy: none     Conduction: normal       IMPRESSION / MDM / ASSESSMENT AND PLAN / ED COURSE  I reviewed the triage vital signs and the nursing notes.                              Differential diagnosis includes, but is not limited to, PE, musculoskeletal pain, ACS, anxiety/panic attack, pneumonia, pneumothorax.  Patient's presentation is most consistent with acute presentation with potential threat to life or bodily function.  Labs/studies ordered: High-sensitivity troponin x 2, D-dimer,  BMP, CBC with differential, two-view chest x-ray, EKG  Interventions/Medications given:  Medications  potassium chloride SA (KLOR-CON M) CR tablet 40 mEq (has no administration in time range)  ketorolac (TORADOL) 30 MG/ML injection 15 mg (has no administration in time range)  ondansetron (ZOFRAN) injection 4 mg (has no administration in time range)    (Note:  hospital course my include additional interventions and/or labs/studies not listed above.)   Cannot be certain that the patient is considered PERC negative given that she has birth control; uncertain if IUD increases her thromboembolic risk.  Vital signs are notable for hypertension but that is likely situational and not the cause of intermittent chest pain for several weeks.  No ischemia on EKG, previously obtained labs are all essentially normal including high-sensitivity troponin.  Very slight hypokalemia addressed with 40 mill colons by mouth.  Had my usual and customary D-dimer discussion with the patient given that I have no other alternative explanation for the pain she is experiencing that could potentially require additional treatment or even hospitalization.  She understands that if the D-dimer is positive, she will need a CTA chest, but if it is negative, she should be appropriate for discharge and outpatient follow-up.  I am ordering Toradol 15 mg IV as well as Zofran 4 mg IV to help with pain and to prevent nausea.  The patient is on the cardiac monitor to evaluate for evidence of arrhythmia and/or significant heart rate changes.   Clinical Course as of 10/22/22 0715  Mon Oct 22, 2022  9563 D-dimer is within normal limits.  No indication for CTA.  Patient will receive her Toradol and I had a talk with her about over-the-counter ibuprofen and Tylenol at home as needed.  I will refer her both to PCP and cardiology for follow-up.  She understands and agrees with the plan.  I feel she is stable and does not need admission for  further evaluation at this time given her low risk for both ACS and PE. [CF]    Clinical Course User Index [CF] Loleta Rose, MD     FINAL CLINICAL IMPRESSION(S) / ED DIAGNOSES   Final diagnoses:  Chest pain, unspecified type     Rx / DC Orders   ED Discharge Orders          Ordered    Ambulatory Referral to Primary Care (Establish Care)        10/22/22 [phone]  Ambulatory referral to Cardiology       Comments: If you have not heard from the Cardiology office within the next 72 hours please call [phone].   10/22/22 2130             Note:  This document was prepared using Dragon voice recognition software and may include unintentional dictation errors.   Loleta Rose, MD 10/22/22 [phone]

## 2022-10-22 NOTE — ED Triage Notes (Signed)
Pt states she has intermittently had chest pain over the past few weeks with sob, which she is relating to stress. Pt states pain woke her up out of her sleep tonight. Pt denies cardiac hx. Pt states she recently stopped smoking.

## 2022-10-22 NOTE — Discharge Instructions (Signed)
You have been seen in the Emergency Department (ED) today for chest pain.  As we have discussed today's test results are normal, and we believe your pain is due to pain/strain and/or inflammation of the muscles and/or cartilage of your chest wall.  We recommend you take ibuprofen 600 mg three times a day with meals for the next 5 days (unless you have been told previously not to take ibuprofen or NSAIDs in general).  You may also take Tylenol according to the label instructions.  Read through the included information for additional treatment recommendations and precautions.  Continue to take your regular medications.   Return to the Emergency Department (ED) if you experience any further chest pain/pressure/tightness, difficulty breathing, or sudden sweating, or other symptoms that concern you.  

## 2022-10-23 DIAGNOSIS — F439 Reaction to severe stress, unspecified: Secondary | ICD-10-CM | POA: Diagnosis not present

## 2022-10-26 ENCOUNTER — Other Ambulatory Visit: Payer: Self-pay

## 2022-10-26 ENCOUNTER — Encounter: Payer: Self-pay | Admitting: Emergency Medicine

## 2022-10-26 ENCOUNTER — Emergency Department: Payer: 59

## 2022-10-26 ENCOUNTER — Emergency Department
Admission: EM | Admit: 2022-10-26 | Discharge: 2022-10-26 | Disposition: A | Discharge status: Home / Self Care | Payer: 59 | Attending: Emergency Medicine | Admitting: Emergency Medicine

## 2022-10-26 DIAGNOSIS — R0789 Other chest pain: Secondary | ICD-10-CM | POA: Insufficient documentation

## 2022-10-26 DIAGNOSIS — R079 Chest pain, unspecified: Secondary | ICD-10-CM | POA: Diagnosis not present

## 2022-10-26 LAB — BASIC METABOLIC PANEL
Anion gap: 11 (ref 5–15)
BUN: 11 mg/dL (ref 6–20)
CO2: 22 mmol/L (ref 22–32)
Calcium: 9 mg/dL (ref 8.9–10.3)
Chloride: 104 mmol/L (ref 98–111)
Creatinine, Ser: 0.66 mg/dL (ref 0.44–1.00)
GFR, Estimated: >60 mL/min (ref >60)
Glucose, Bld: 82 mg/dL (ref 70–99)
Potassium: 3.7 mmol/L (ref 3.5–5.1)
Sodium: 137 mmol/L (ref 135–145)

## 2022-10-26 LAB — CBC
HCT: 35.5 % — ABNORMAL LOW (ref 36.0–46.0)
Hemoglobin: 11.9 g/dL — ABNORMAL LOW (ref 12.0–15.0)
MCH: 30.5 pg (ref 26.0–34.0)
MCHC: 33.5 g/dL (ref 30.0–36.0)
MCV: 91 fL (ref 80.0–100.0)
Platelets: 204 10*3/uL (ref 150–400)
RBC: 3.9 MIL/uL (ref 3.87–5.11)
RDW: 12.1 % (ref 11.5–15.5)
WBC: 5.7 10*3/uL (ref 4.0–10.5)
nRBC: 0 % (ref 0.0–0.2)

## 2022-10-26 LAB — TROPONIN I (HIGH SENSITIVITY): Troponin I (High Sensitivity): <2 ng/L (ref <18)

## 2022-10-26 MED ORDER — KETOROLAC TROMETHAMINE 30 MG/ML IJ SOLN
30.0000 mg | Freq: Once | INTRAMUSCULAR | Status: AC
  Administered 2022-10-26: 30 mg via INTRAMUSCULAR
  Filled 2022-10-26: qty 1

## 2022-10-26 MED ORDER — HYDROXYZINE HCL 25 MG PO TABS: 25.0000 mg | ORAL_TABLET | Freq: Three times a day (TID) | ORAL | 1 refills | Status: DC | PRN

## 2022-10-26 MED ORDER — HYDROXYZINE HCL 25 MG PO TABS
25.0000 mg | ORAL_TABLET | Freq: Once | ORAL | Status: AC
  Administered 2022-10-26: 25 mg via ORAL
  Filled 2022-10-26: qty 1

## 2022-10-26 NOTE — Discharge Instructions (Addendum)
Please take Tylenol and ibuprofen/Advil for your pain.  It is safe to take them together, or to alternate them every few hours.  Take up to 1000mg  of Tylenol at a time, up to 4 times per day.  Do not take more than 4000 mg of Tylenol in 24 hours.  For ibuprofen, take 400-600 mg, 3 - 4 times per day.  Hydroxyzine as needed up to 3-4 times per day to help with racing thoughts, anxiety or stress.

## 2022-10-26 NOTE — ED Triage Notes (Signed)
Pt presents ambulatory to triage via POV with complaints of intermittent centralized CP x 3 weeks. She notes being seen here Monday for the same thing. She states the pain is exacerbated with deep breaths. A&Ox4 at this time. Denies fevers, chills, cough, or SOB.

## 2022-10-26 NOTE — ED Provider Notes (Signed)
[facility] Provider Note    Event Date/Time   First MD Initiated Contact with Patient 10/26/22 [phone]     (approximate)   History   Chest Pain   HPI  Daisy Torres is a 27 y.o. female who presents to the ED for evaluation of Chest Pain   I reviewed ED visit from 4 days ago where patient was evaluated for the same.  No chronic medical conditions.  She had a negative D-dimer, troponins, x-ray and overall reassuring workup and discharged home.  Referred to cardiology.  Patient presents to the ED for evaluation of continued intermittent chest discomfort.  Reports sensation of racing thoughts and stress that she is moving to a new home soon.  She did schedule a cardiology appointment, but it is not for 2 months.  She reports her chest pain is resolved at the time I see her in the ED. she is requesting the medication she got last time when she was seen in the ED, Toradol.   Physical Exam   Triage Vital Signs: ED Triage Vitals  Encounter Vitals Group     BP 10/26/22 0145 (!) 128/93     Systolic BP Percentile --      Diastolic BP Percentile --      Pulse Rate 10/26/22 0145 76     Resp 10/26/22 0145 20     Temp 10/26/22 0145 98 F (36.7 C)     Temp Source 10/26/22 0145 Oral     SpO2 10/26/22 0145 100 %     Weight 10/26/22 0150 144 lb 14.2 oz (65.7 kg)     Height 10/26/22 0150 5\' 5"  (1.651 m)     Head Circumference --      Peak Flow --      Pain Score 10/26/22 0150 6     Pain Loc --      Pain Education --      Exclude from Growth Chart --     Most recent vital signs: Vitals:   10/26/22 0145  BP: (!) 128/93  Pulse: 76  Resp: 20  Temp: 98 F (36.7 C)  SpO2: 100%    General: Awake, no distress.  CV:  Good peripheral perfusion.  Resp:  Normal effort.  Abd:  No distention.  MSK:  No deformity noted.  Neuro:  No focal deficits appreciated. Other:     ED Results / Procedures / Treatments   Labs (all labs ordered are listed, but only  abnormal results are displayed) Labs Reviewed  CBC - Abnormal; Notable for the following components:      Result Value   Hemoglobin 11.9 (*)    HCT 35.5 (*)    All other components within normal limits  BASIC METABOLIC PANEL  POC URINE PREG, ED  TROPONIN I (HIGH SENSITIVITY)  TROPONIN I (HIGH SENSITIVITY)    EKG Sinus rhythm with a rate of 68 bpm.  Normal axis and intervals.  No clear signs of acute ischemia.  RADIOLOGY CXR interpreted by me without evidence of acute cardiopulmonary pathology.  Official radiology report(s): DG Chest 2 View  Result Date: 10/26/2022 CLINICAL DATA:  Chest pain for 3 weeks EXAM: CHEST - 2 VIEW COMPARISON:  10/22/2022 FINDINGS: The heart size and mediastinal contours are within normal limits. Both lungs are clear. The visualized skeletal structures are unremarkable. IMPRESSION: No active cardiopulmonary disease. Electronically Signed   By: Alcide Clever M.D.   On: 10/26/2022 02:32    PROCEDURES and INTERVENTIONS:  .  1-3 Lead EKG Interpretation  Performed by: Delton Prairie, MD Authorized by: Delton Prairie, MD     Interpretation: normal     ECG rate:  80   ECG rate assessment: normal     Rhythm: sinus rhythm     Ectopy: none     Conduction: normal     Medications  hydrOXYzine (ATARAX) tablet 25 mg (has no administration in time range)  ketorolac (TORADOL) 30 MG/ML injection 30 mg (has no administration in time range)     IMPRESSION / MDM / ASSESSMENT AND PLAN / ED COURSE  I reviewed the triage vital signs and the nursing notes.  Differential diagnosis includes, but is not limited to, ACS, PTX, PNA, muscle strain/spasm, PE, dissection, anxiety, pleural effusion  {Patient presents with symptoms of an acute illness or injury that is potentially life-threatening.  Patient presents with continued intermittent atypical chest discomfort that is likely more related to stress or anxiety, or MSK pain.  Similarly reassuring workup with nonischemic EKG,  negative troponin, normal CBC and metabolic panel.  CXR is clear.  She is chest pain-free.  Provide Toradol and Atarax, discharged with Atarax.  Discussed return precautions      FINAL CLINICAL IMPRESSION(S) / ED DIAGNOSES   Final diagnoses:  Other chest pain     Rx / DC Orders   ED Discharge Orders          Ordered    hydrOXYzine (ATARAX) 25 MG tablet  3 times daily PRN        10/26/22 0518             Note:  This document was prepared using Dragon voice recognition software and may include unintentional dictation errors.   Delton Prairie, MD 10/26/22 [phone]

## 2022-10-30 ENCOUNTER — Telehealth: Payer: Self-pay | Admitting: Family Medicine

## 2022-10-30 DIAGNOSIS — F439 Reaction to severe stress, unspecified: Secondary | ICD-10-CM | POA: Diagnosis not present

## 2022-10-30 NOTE — Telephone Encounter (Signed)
Please give me a call I have Question about my iud and the removal process

## 2022-11-07 DIAGNOSIS — F439 Reaction to severe stress, unspecified: Secondary | ICD-10-CM | POA: Diagnosis not present

## 2022-11-13 DIAGNOSIS — F439 Reaction to severe stress, unspecified: Secondary | ICD-10-CM | POA: Diagnosis not present

## 2022-11-20 DIAGNOSIS — F439 Reaction to severe stress, unspecified: Secondary | ICD-10-CM | POA: Diagnosis not present

## 2022-11-22 ENCOUNTER — Ambulatory Visit: Payer: 59

## 2022-11-26 DIAGNOSIS — F439 Reaction to severe stress, unspecified: Secondary | ICD-10-CM | POA: Diagnosis not present

## 2022-12-03 DIAGNOSIS — F439 Reaction to severe stress, unspecified: Secondary | ICD-10-CM | POA: Diagnosis not present

## 2022-12-06 ENCOUNTER — Emergency Department (HOSPITAL_COMMUNITY)
Admission: EM | Admit: 2022-12-06 | Discharge: 2022-12-07 | Disposition: A | Discharge status: Home / Self Care | Payer: 59 | Attending: Emergency Medicine | Admitting: Emergency Medicine

## 2022-12-06 ENCOUNTER — Emergency Department
Admission: EM | Admit: 2022-12-06 | Discharge: 2022-12-06 | Discharge status: Against Medical Advice | Payer: 59 | Source: Home / Self Care

## 2022-12-06 ENCOUNTER — Encounter (HOSPITAL_COMMUNITY): Payer: Self-pay | Admitting: Emergency Medicine

## 2022-12-06 ENCOUNTER — Emergency Department (HOSPITAL_COMMUNITY): Payer: 59

## 2022-12-06 ENCOUNTER — Other Ambulatory Visit: Payer: Self-pay

## 2022-12-06 DIAGNOSIS — Z30432 Encounter for removal of intrauterine contraceptive device: Secondary | ICD-10-CM | POA: Diagnosis present

## 2022-12-06 DIAGNOSIS — Z975 Presence of (intrauterine) contraceptive device: Secondary | ICD-10-CM

## 2022-12-06 DIAGNOSIS — R079 Chest pain, unspecified: Secondary | ICD-10-CM | POA: Diagnosis not present

## 2022-12-06 DIAGNOSIS — T8332XA Displacement of intrauterine contraceptive device, initial encounter: Secondary | ICD-10-CM | POA: Diagnosis not present

## 2022-12-06 LAB — URINALYSIS, ROUTINE W REFLEX MICROSCOPIC
Bilirubin Urine: NEGATIVE
Glucose, UA: NEGATIVE mg/dL
Hgb urine dipstick: NEGATIVE
Ketones, ur: 20 mg/dL — AB
Leukocytes,Ua: NEGATIVE
Nitrite: NEGATIVE
Protein, ur: NEGATIVE mg/dL
Specific Gravity, Urine: 1.011 (ref 1.005–1.030)
pH: 6 (ref 5.0–8.0)

## 2022-12-06 NOTE — ED Triage Notes (Addendum)
Patient here states she had an IUD placed at [facility] back in February. Patient reports since then she is experiencing anxiety chronically since then along with months of chest pain, feeling palpitations. States she was seen at [facility] and [facility]. Called up here and was told [facility] will remove her IUD. Denies SI/ HI. Reports she did attempt to make an appointment with the health department but couldn't get one until Oct. 14th.

## 2022-12-07 DIAGNOSIS — Z30432 Encounter for removal of intrauterine contraceptive device: Secondary | ICD-10-CM | POA: Diagnosis not present

## 2022-12-07 LAB — CBC
HCT: 37.2 % (ref 36.0–46.0)
Hemoglobin: 12.5 g/dL (ref 12.0–15.0)
MCH: 31 pg (ref 26.0–34.0)
MCHC: 33.6 g/dL (ref 30.0–36.0)
MCV: 92.3 fL (ref 80.0–100.0)
Platelets: 245 10*3/uL (ref 150–400)
RBC: 4.03 MIL/uL (ref 3.87–5.11)
RDW: 12.3 % (ref 11.5–15.5)
WBC: 5.8 10*3/uL (ref 4.0–10.5)
nRBC: 0 % (ref 0.0–0.2)

## 2022-12-07 LAB — BASIC METABOLIC PANEL
Anion gap: 15 (ref 5–15)
BUN: 12 mg/dL (ref 6–20)
CO2: 22 mmol/L (ref 22–32)
Calcium: 9.3 mg/dL (ref 8.9–10.3)
Chloride: 102 mmol/L (ref 98–111)
Creatinine, Ser: 0.77 mg/dL (ref 0.44–1.00)
GFR, Estimated: >60 mL/min (ref >60)
Glucose, Bld: 101 mg/dL — ABNORMAL HIGH (ref 70–99)
Potassium: 3.6 mmol/L (ref 3.5–5.1)
Sodium: 139 mmol/L (ref 135–145)

## 2022-12-07 LAB — TROPONIN I (HIGH SENSITIVITY)
Troponin I (High Sensitivity): 2 ng/L (ref <18)
Troponin I (High Sensitivity): <2 ng/L (ref <18)

## 2022-12-07 LAB — HCG, SERUM, QUALITATIVE: Preg, Serum: NEGATIVE

## 2022-12-07 NOTE — Discharge Instructions (Addendum)
I would try to call the OB/GYN this morning to see if they can get you in sooner for IUD removal.  Can also try the [facility] department as well.

## 2022-12-07 NOTE — ED Provider Notes (Signed)
[place] EMERGENCY DEPARTMENT AT [facility] Provider Note   CSN: [id number] Arrival date & time: 12/06/22  2307     History  Chief Complaint  Patient presents with   IUD removal    Daisy Torres is a 27 y.o. female.  The history is provided by the patient and medical records.   27 year old female with history of adjustment disorder, presenting to the ED requesting that her IUD be removed.  She reports she had this placed in February at the [facility].  States since that time she has had a lot of lower abdominal cramping, anxiety, panic attacks, palpitations, etc.  She states she went back to the health department yesterday, however they do not have an appointment for removal until 12/24/2022 and she does not feel like she can wait that long.  She apparently called the hospital and was told that we can remove them so she drove here from [facility] to have this done.  She denies any irregular bleeding or vaginal discharge.  She has never had an IUD before.  Home Medications Prior to Admission medications   Medication Sig Start Date End Date Taking? Authorizing Provider  hydrOXYzine (ATARAX) 25 MG tablet Take 1 tablet (25 mg total) by mouth 3 (three) times daily as needed. 10/26/22   Delton Prairie, MD  ibuprofen (ADVIL) 600 MG tablet Take 600 mg by mouth every 8 (eight) hours as needed for headache.    [provider]      Allergies    Patient has no known allergies.    Review of Systems   Review of Systems  Genitourinary:        Wants IUD removed  All other systems reviewed and are negative.   Physical Exam Updated Vital Signs BP 109/79 (BP Location: Left Arm)   Pulse 64   Temp 98.3 F (36.8 C) (Oral)   Resp 14   Ht 5\' 5"  (1.651 m)   Wt 65 kg   SpO2 100%   BMI 23.85 kg/m   Physical Exam Vitals and nursing note reviewed.  Constitutional:      Appearance: She is well-developed.  HENT:     Head: Normocephalic and  atraumatic.  Eyes:     Conjunctiva/sclera: Conjunctivae normal.     Pupils: Pupils are equal, round, and reactive to light.  Cardiovascular:     Rate and Rhythm: Normal rate and regular rhythm.     Heart sounds: Normal heart sounds.  Pulmonary:     Effort: Pulmonary effort is normal. No respiratory distress.     Breath sounds: Normal breath sounds. No rhonchi.  Abdominal:     General: Bowel sounds are normal.     Palpations: Abdomen is soft.  Musculoskeletal:        General: Normal range of motion.     Cervical back: Normal range of motion.  Skin:    General: Skin is warm and dry.  Neurological:     Mental Status: She is alert and oriented to person, place, and time.     ED Results / Procedures / Treatments   Labs (all labs ordered are listed, but only abnormal results are displayed) Labs Reviewed  URINALYSIS, ROUTINE W REFLEX MICROSCOPIC - Abnormal; Notable for the following components:      Result Value   APPearance HAZY (*)    Ketones, ur 20 (*)    All other components within normal limits  BASIC METABOLIC PANEL - Abnormal; Notable for the  following components:   Glucose, Bld 101 (*)    All other components within normal limits  CBC  HCG, SERUM, QUALITATIVE  TROPONIN I (HIGH SENSITIVITY)  TROPONIN I (HIGH SENSITIVITY)    EKG None  Radiology DG Chest 2 View  Result Date: 12/07/2022 CLINICAL DATA:  Chest pain in the middle of chest. EXAM: CHEST - 2 VIEW COMPARISON:  Radiograph 10/26/2022 FINDINGS: Normal cardiomediastinal silhouette. No focal consolidation, pleural effusion, or pneumothorax. No displaced rib fractures. IMPRESSION: No acute cardiopulmonary disease. Electronically Signed   By: Minerva Fester M.D.   On: 12/07/2022 01:27    Procedures Procedures    Medications Ordered in ED Medications - No data to display  ED Course/ Medical Decision Making/ A&P                                 Medical Decision Making Amount and/or Complexity of Data  Reviewed Labs: ordered. Radiology: ordered and independent interpretation performed. ECG/medicine tests: ordered and independent interpretation performed.   27 year old female requesting IUD removal.  She reports she is having adverse side effects from this including abdominal cramping, anxiety, panic attacks, etc.  Called health department that placed this in February, however they cannot see her for over 2 weeks and she does not feel like she can wait that long.  She is requesting for it to be removed today.  She reported she was having some palpitations earlier but denies this currently.  She is not having any chest pain.  Her labs today are reassuring, troponin negative x 2.  Chest x-ray is clear.  She is not having any severe abdominal pain or irregular bleeding.  No vaginal discharge or severe pelvic pain.  I doubt PID or other complication related to her IUD.  It seems she is having more hormonal/emotional issues.  I discussed with patient that we do not routinely remove IUDs here in the emergency department.  I have offered to refer her to OB/GYN to see if they can possibly see her sooner, otherwise she may have to wait until her appointment with the health department on 12/24/22..  She is upset but understanding of this.  She can return here for new concerns.    Final Clinical Impression(s) / ED Diagnoses Final diagnoses:  IUD (intrauterine device) in place    Rx / DC Orders ED Discharge Orders     None         Garlon Hatchet, PA-C 12/07/22 0530    Sabas Sous, MD 12/07/22 [phone]

## 2022-12-10 ENCOUNTER — Ambulatory Visit (LOCAL_COMMUNITY_HEALTH_CENTER): Payer: 59 | Admitting: Family Medicine

## 2022-12-10 ENCOUNTER — Encounter: Payer: 59 | Admitting: Obstetrics

## 2022-12-10 VITALS — BP 118/80 | HR 81 | Ht 65.0 in | Wt 139.6 lb

## 2022-12-10 DIAGNOSIS — Z30432 Encounter for removal of intrauterine contraceptive device: Secondary | ICD-10-CM

## 2022-12-10 DIAGNOSIS — B9689 Other specified bacterial agents as the cause of diseases classified elsewhere: Secondary | ICD-10-CM

## 2022-12-10 DIAGNOSIS — F439 Reaction to severe stress, unspecified: Secondary | ICD-10-CM | POA: Diagnosis not present

## 2022-12-10 DIAGNOSIS — Z309 Encounter for contraceptive management, unspecified: Secondary | ICD-10-CM | POA: Diagnosis not present

## 2022-12-10 DIAGNOSIS — Z113 Encounter for screening for infections with a predominantly sexual mode of transmission: Secondary | ICD-10-CM

## 2022-12-10 DIAGNOSIS — Z3009 Encounter for other general counseling and advice on contraception: Secondary | ICD-10-CM | POA: Diagnosis not present

## 2022-12-10 LAB — WET PREP FOR TRICH, YEAST, CLUE
Trichomonas Exam: NEGATIVE
Yeast Exam: NEGATIVE

## 2022-12-10 MED ORDER — METRONIDAZOLE 500 MG PO TABS: 500.0000 mg | ORAL_TABLET | Freq: Two times a day (BID) | ORAL | Status: AC

## 2022-12-10 NOTE — Progress Notes (Signed)
Pt is here for IUD removal.  Wet mount results reviewed.  The patient was dispensed Metronidazole 500 mg #14 today. I provided counseling today regarding the medication. We discussed the medication, the side effects and when to call clinic. Patient given the opportunity to ask questions. Questions answered.  Condoms declined.  Berdie Ogren, RN

## 2022-12-10 NOTE — Progress Notes (Signed)
   [facility] Problem Visit  [facility]  Subjective:  Daisy Torres is a 27 y.o. being seen today for   Chief Complaint  Patient presents with   Contraception    IUD removal and STD screening    HPI   Pt reports severe changes in mood since IUD placement in March. States she has been having panic attacks, severe anxiety and has documented visits to ED regarding this. Pt strongly desires to have IUD removed today. Reports abstinence. Desires testing today for BV/yeast.    Health Maintenance Due  Topic Date Due   DTaP/Tdap/Td (1 - Tdap) Never done   INFLUENZA VACCINE  Never done   COVID-19 Vaccine (3 - 2023-24 season) 11/11/2022    Review of Systems  Psychiatric/Behavioral:  The patient is nervous/anxious.     The following portions of the patient's history were reviewed and updated as appropriate: allergies, current medications, past family history, past medical history, past social history, past surgical history and problem list. Problem list updated.   See flowsheet for other program required questions.  Objective:   Vitals:   12/10/22 1413  BP: 118/80  Pulse: 81  Weight: 139 lb 9.6 oz (63.3 kg)  Height: 5\' 5"  (1.651 m)    Physical Exam Exam conducted with a chaperone present Justice Rocher RN).  Genitourinary:    General: Normal vulva.     Exam position: Lithotomy position.     Tanner stage (genital): 5.     Labia:        Right: No tenderness.        Left: No tenderness.      Vagina: Vaginal discharge present.     Cervix: Normal.     Rectum: Normal.     Comments: pH= 5      Assessment and Plan:  Daisy Torres is a 27 y.o. female presenting to the [facility] Department for a Women's Health problem visit  1. Screening for venereal disease  - WET PREP FOR TRICH, YEAST, CLUE  2. Encounter for IUD removal   IUD Removal  Patient identified, informed consent performed, consent signed.  Patient  was in the dorsal lithotomy position, normal external genitalia was noted.  A speculum was placed in the patient's vagina, normal discharge was noted, no lesions. The cervix was visualized, no lesions, no abnormal discharge.  The strings of the IUD were grasped and pulled using ring forceps. The IUD was removed in its entirety.  .  Patient tolerated the procedure well.    Patient will use abstinence for contraception. Routine preventative health maintenance measures emphasized.    3. Bacterial vaginosis  - metroNIDAZOLE (FLAGYL) 500 MG tablet; Take 1 tablet (500 mg total) by mouth 2 (two) times daily for 7 days.    Return if symptoms worsen or fail to improve.  Future Appointments  Date Time Provider Department Center  12/17/2022 10:40 AM Debbe Odea, MD [facility] None   Total time spent 20 minutes  Lenice Llamas, [place]

## 2022-12-12 ENCOUNTER — Other Ambulatory Visit: Payer: Self-pay

## 2022-12-12 ENCOUNTER — Emergency Department: Payer: 59

## 2022-12-12 ENCOUNTER — Emergency Department
Admission: EM | Admit: 2022-12-12 | Discharge: 2022-12-12 | Disposition: A | Discharge status: Home / Self Care | Payer: 59 | Attending: Emergency Medicine | Admitting: Emergency Medicine

## 2022-12-12 ENCOUNTER — Encounter: Payer: Self-pay | Admitting: Emergency Medicine

## 2022-12-12 DIAGNOSIS — R0602 Shortness of breath: Secondary | ICD-10-CM | POA: Insufficient documentation

## 2022-12-12 DIAGNOSIS — R079 Chest pain, unspecified: Secondary | ICD-10-CM | POA: Diagnosis not present

## 2022-12-12 DIAGNOSIS — R0789 Other chest pain: Secondary | ICD-10-CM | POA: Diagnosis not present

## 2022-12-12 LAB — BASIC METABOLIC PANEL
Anion gap: 5 (ref 5–15)
BUN: 8 mg/dL (ref 6–20)
CO2: 23 mmol/L (ref 22–32)
Calcium: 8.7 mg/dL — ABNORMAL LOW (ref 8.9–10.3)
Chloride: 108 mmol/L (ref 98–111)
Creatinine, Ser: 0.65 mg/dL (ref 0.44–1.00)
GFR, Estimated: >60 mL/min (ref >60)
Glucose, Bld: 80 mg/dL (ref 70–99)
Potassium: 3.5 mmol/L (ref 3.5–5.1)
Sodium: 136 mmol/L (ref 135–145)

## 2022-12-12 LAB — CBC
HCT: 32.8 % — ABNORMAL LOW (ref 36.0–46.0)
Hemoglobin: 11.1 g/dL — ABNORMAL LOW (ref 12.0–15.0)
MCH: 30.8 pg (ref 26.0–34.0)
MCHC: 33.8 g/dL (ref 30.0–36.0)
MCV: 91.1 fL (ref 80.0–100.0)
Platelets: 207 10*3/uL (ref 150–400)
RBC: 3.6 MIL/uL — ABNORMAL LOW (ref 3.87–5.11)
RDW: 12.3 % (ref 11.5–15.5)
WBC: 4.6 10*3/uL (ref 4.0–10.5)
nRBC: 0 % (ref 0.0–0.2)

## 2022-12-12 LAB — TROPONIN I (HIGH SENSITIVITY): Troponin I (High Sensitivity): <2 ng/L (ref <18)

## 2022-12-12 NOTE — Discharge Instructions (Signed)
Follow-up with the cardiologist as scheduled.  You may continue to take ibuprofen or Tylenol as needed for pain.  Return to the ER for new, worsening, or persistent severe chest pain, difficulty breathing, or any other new or worsening symptoms that concern you.

## 2022-12-12 NOTE — ED Notes (Signed)
Pt given snack and up to restroom.

## 2022-12-12 NOTE — ED Triage Notes (Signed)
Patient ambulatory to triage with steady gait, without difficulty or distress noted; pt reports for several mos having SHOB and upper chest tightness; has been seen for same with no abnormal findings but was referred to a cardiologist; rx anti-anxiety meds without relief; st she believes her anxiety was caused by her IUD and she had it removed on Monday

## 2022-12-12 NOTE — ED Provider Notes (Signed)
[facility] Provider Note    Event Date/Time   First MD Initiated Contact with Patient 12/12/22 [phone]     (approximate)  History   Chief Complaint: Shortness of Breath  HPI  Daisy Torres is a 27 y.o. female with no past medical history who presents to the emergency department for chest pain.  According to the patient on a near daily basis she has been experiencing pain and tightness in the chest often times that awakes her from her sleep.  Patient has been seen here multiple times for the same with negative workups, has been told this could be anxiety related was prescribed hydroxyzine but the patient did not like how it made her feel so she is not taking it.  Patient does have a follow-up appointment on Monday with cardiology for further evaluation.  Patient denies any cardiac history.  States the tightness sensation is now gone and she is feeling back to normal.  Denies any shortness of breath nausea or diaphoresis.  States her symptoms tonight started approximately 5 hours ago but are now relieved.  Physical Exam   Triage Vital Signs: ED Triage Vitals  Encounter Vitals Group     BP 12/12/22 0524 (!) 137/96     Systolic BP Percentile --      Diastolic BP Percentile --      Pulse Rate 12/12/22 0524 82     Resp 12/12/22 0524 19     Temp 12/12/22 0524 97.8 F (36.6 C)     Temp Source 12/12/22 0524 Oral     SpO2 12/12/22 0524 100 %     Weight 12/12/22 0518 139 lb (63 kg)     Height 12/12/22 0518 5\' 5"  (1.651 m)     Head Circumference --      Peak Flow --      Pain Score 12/12/22 0518 8     Pain Loc --      Pain Education --      Exclude from Growth Chart --     Most recent vital signs: Vitals:   12/12/22 0524  BP: (!) 137/96  Pulse: 82  Resp: 19  Temp: 97.8 F (36.6 C)  SpO2: 100%    General: Awake, no distress.  CV:  Good peripheral perfusion.  Regular rate and rhythm  Resp:  Normal effort.  Equal breath sounds bilaterally.  Chest wall is  nontender to palpation.  No wheeze rales or rhonchi. Abd:  No distention.  Soft, nontender.  No rebound or guarding.  ED Results / Procedures / Treatments   EKG  EKG viewed and interpreted by myself shows a sinus rhythm at 69 bpm with a narrow QRS, normal axis, normal intervals, no concerning ST changes.  MEDICATIONS ORDERED IN ED: Medications - No data to display   IMPRESSION / MDM / ASSESSMENT AND PLAN / ED COURSE  I reviewed the triage vital signs and the nursing notes.  Patient's presentation is most consistent with acute presentation with potential threat to life or bodily function.  Patient presents to the emergency department for central chest pain/tightness that awoke her from sleep around 1 AM but has since alleviated.  Patient states a history of this happening nearly every day.  Has been told in the past that it could be anxiety related.  Patient has a cardiology appointment scheduled this coming Monday but was concerned so she came to the emergency department today for evaluation.  Patient states since arriving to the emergency  department the pain is gone.  No shortness of breath nausea or diaphoresis at any point.  FINAL CLINICAL IMPRESSION(S) / ED DIAGNOSES   Chest pain    Note:  This document was prepared using Dragon voice recognition software and may include unintentional dictation errors.   Minna Antis, MD 12/12/22 [phone]

## 2022-12-12 NOTE — ED Notes (Signed)
Pt given warm blanket.

## 2022-12-12 NOTE — ED Provider Notes (Signed)
-----------------------------------------   8:57 AM on 12/12/2022 -----------------------------------------  I took over care of this patient from Dr. Lenard Lance.  BMP and CBC show no acute findings.  Troponin is negative.  Based on the duration of the symptoms there is no indication for repeat.  The patient's chest pain has resolved and she is asymptomatic at this time.  She has cardiology follow-up already scheduled.  She is stable for discharge home.  I gave strict return precautions and she expressed understanding.   Dionne Bucy, MD 12/12/22 [phone]

## 2022-12-17 ENCOUNTER — Ambulatory Visit: Payer: 59 | Attending: Cardiology | Admitting: Cardiology

## 2022-12-17 ENCOUNTER — Encounter: Payer: Self-pay | Admitting: Cardiology

## 2022-12-17 VITALS — BP 112/78 | HR 89 | Ht 65.0 in | Wt 138.2 lb

## 2022-12-17 DIAGNOSIS — R002 Palpitations: Secondary | ICD-10-CM

## 2022-12-17 DIAGNOSIS — R079 Chest pain, unspecified: Secondary | ICD-10-CM | POA: Diagnosis not present

## 2022-12-17 NOTE — Patient Instructions (Signed)
Medication Instructions:   Your physician recommends that you continue on your current medications as directed. Please refer to the Current Medication list given to you today.  *If you need a refill on your cardiac medications before your next appointment, please call your pharmacy*   Lab Work:  None Ordered  If you have labs (blood work) drawn today and your tests are completely normal, you will receive your results only by: MyChart Message (if you have MyChart) OR A paper copy in the mail If you have any lab test that is abnormal or we need to change your treatment, we will call you to review the results.   Testing/Procedures:  None Ordered   Follow-Up: At [facility], you and your health needs are our priority.  As part of our continuing mission to provide you with exceptional heart care, we have created designated Provider Care Teams.  These Care Teams include your primary Cardiologist (physician) and Advanced Practice Providers (APPs -  Physician Assistants and Nurse Practitioners) who all work together to provide you with the care you need, when you need it.  We recommend signing up for the patient portal called "MyChart".  Sign up information is provided on this After Visit Summary.  MyChart is used to connect with patients for Virtual Visits (Telemedicine).  Patients are able to view lab/test results, encounter notes, upcoming appointments, etc.  Non-urgent messages can be sent to your provider as well.   To learn more about what you can do with MyChart, go to [email or web address].    Your next appointment:    As needed 

## 2022-12-17 NOTE — Progress Notes (Signed)
Cardiology Office Note:    Date:  12/17/2022   ID:  Daisy Torres, DOB 1995/04/29, MRN [id number]  PCP:  Practice, [facility] Providers Cardiologist:  None     Referring MD: Loleta Rose, MD   Chief Complaint  Patient presents with   New Patient (Initial Visit)    Cardiac evaluation for chest pain.  Multiple visits to ED for chest pain with no cardiac history.  Intermittent chest pains have improved some since removing IUD.  Gets woke up during the night with heart palpitations.     Daisy Torres is a 27 y.o. female who is being seen today for the evaluation of chest pain at the request of Loleta Rose, MD.   History of Present Illness:    Daisy Torres is a 27 y.o. female with a hx of smoking x 10 years previously, being seen due to palpitations and chest pain.  She had an IUD placed about 8 months ago.  1 to 2 months after, noticed chest pain and palpitations.  Did not make much of it.  Presented to the ED last week due to similar symptoms.  EKG and troponins were unrevealing.  Had IUD removed last week, symptoms have improved since.  Denies any immediate family history of heart disease, feels well, no new concerns at this time.  History reviewed. No pertinent past medical history.  Past Surgical History:  Procedure Laterality Date   butt lift Bilateral    Brazillian Butt lift   EYE SURGERY      Current Medications: Current Meds  Medication Sig   ibuprofen (ADVIL) 600 MG tablet Take 600 mg by mouth every 8 (eight) hours as needed for headache.     Allergies:   Patient has no known allergies.   Social History   Socioeconomic History   Marital status: Single    Spouse name: NA   Number of children: 0   Years of education: 12   Highest education level: High school graduate  Occupational History   Not on file  Tobacco Use   Smoking status: Former    Current packs/day: 0.00    Types: Cigarettes    Quit date: 12/16/2012     Years since quitting: 10.0   Smokeless tobacco: Never   Tobacco comments:    Smokes marijuana in a nicotine wrap.   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 Patient reports that her family is her support system.    Social Determinants of Health   Financial Resource Strain: Not on file  Food Insecurity: Not on file  Transportation Needs: Not on file  Physical Activity: Not on file  Stress: Not on file  Social Connections: Unknown (06/05/2022)   Received [facility]   Social Network    Social Network: Not on file     Family History: The patient's family history includes Alcohol abuse in her maternal uncle and mother; Diabetes in her maternal grandmother; Drug abuse in her maternal grandfather; Heart disease in her maternal grandfather.  ROS:   Please see  the history of present illness.     All other systems reviewed and are negative.  EKGs/Labs/Other Studies Reviewed:    The following studies were reviewed today:  EKG Interpretation Date/Time:  Monday December 17 2022 11:17:25 EDT Ventricular Rate:  89 PR Interval:  136 QRS Duration:  70 QT Interval:  346 QTC Calculation: 420 R Axis:   66  Text Interpretation: Normal sinus rhythm Normal ECG Confirmed by Debbe Odea ([id number]) on 12/17/2022 11:26:19 AM    Recent Labs: 12/12/2022: BUN 8; Creatinine, Ser 0.65; Hemoglobin 11.1; Platelets 207; Potassium 3.5; Sodium 136  Recent Lipid Panel No results found for: "CHOL", "TRIG", "HDL", "CHOLHDL", "VLDL", "LDLCALC", "LDLDIRECT"   Risk Assessment/Calculations:             Physical Exam:    VS:  BP 112/78 (BP Location: Right Arm, Patient  Position: Sitting, Cuff Size: Normal)   Pulse 89   Ht 5\' 5"  (1.651 m)   Wt 138 lb 3.2 oz (62.7 kg)   LMP  (LMP Unknown)   BMI 23.00 kg/m     Wt Readings from Last 3 Encounters:  12/17/22 138 lb 3.2 oz (62.7 kg)  12/12/22 139 lb (63 kg)  12/10/22 139 lb 9.6 oz (63.3 kg)     GEN:  Well nourished, well developed in no acute distress HEENT: Normal NECK: No JVD; No carotid bruits CARDIAC: RRR, no murmurs, rubs, gallops RESPIRATORY:  Clear to auscultation without rales, wheezing or rhonchi  ABDOMEN: Soft, non-tender, non-distended MUSCULOSKELETAL:  No edema; No deformity  SKIN: Warm and dry NEUROLOGIC:  Alert and oriented x 3 PSYCHIATRIC:  Normal affect   ASSESSMENT:    1. Chest pain, unspecified type   2. Palpitations    PLAN:    In order of problems listed above:  Nonspecific chest pain, palpitations associated with IUD placement.  Symptoms overall improved/resolved with removal of IUD suggesting a noncardiac etiology.  Patient advised to monitor symptoms, if symptoms return while IUD is removed, will consider cardiac testing.  Congratulated on quitting smoking, heart healthy diet and lifestyle encouraged.  Follow-up as needed      Medication Adjustments/Labs and Tests Ordered: Current medicines are reviewed at length with the patient today.  Concerns regarding medicines are outlined above.  Orders Placed This Encounter  Procedures   EKG 12-Lead   No orders of the defined types were placed in this encounter.   Patient Instructions  Medication Instructions:   Your physician recommends that you continue on your current medications as directed. Please refer to the Current Medication list given to you today.  *If you need a refill on your cardiac medications before your next appointment, please call your pharmacy*   Lab Work:  None Ordered  If you have labs (blood work) drawn today and your tests are completely normal, you will receive your results only  by: MyChart Message (if you have MyChart) OR A paper copy in the mail If you have any lab test that is abnormal or we need to change your treatment, we will call you to review the results.   Testing/Procedures:  None Ordered   Follow-Up: At [facility], you and your health needs are our priority.  As part of our continuing mission to provide you with exceptional heart care, we have created designated Provider Care Teams.  These Care Teams include your primary Cardiologist (physician) and Advanced Practice Providers (APPs -  Physician Assistants and Nurse Practitioners) who all work together to provide you with the  care you need, when you need it.  We recommend signing up for the patient portal called "MyChart".  Sign up information is provided on this After Visit Summary.  MyChart is used to connect with patients for Virtual Visits (Telemedicine).  Patients are able to view lab/test results, encounter notes, upcoming appointments, etc.  Non-urgent messages can be sent to your provider as well.   To learn more about what you can do with MyChart, go to [email or web address].    Your next appointment:    As needed    Signed, Debbe Odea, MD  12/17/2022 1:27 PM    [facility]

## 2022-12-19 DIAGNOSIS — F439 Reaction to severe stress, unspecified: Secondary | ICD-10-CM | POA: Diagnosis not present

## 2022-12-24 ENCOUNTER — Ambulatory Visit: Payer: 59

## 2022-12-25 DIAGNOSIS — F439 Reaction to severe stress, unspecified: Secondary | ICD-10-CM | POA: Diagnosis not present

## 2023-01-17 DIAGNOSIS — F439 Reaction to severe stress, unspecified: Secondary | ICD-10-CM | POA: Diagnosis not present

## 2023-01-23 DIAGNOSIS — F439 Reaction to severe stress, unspecified: Secondary | ICD-10-CM | POA: Diagnosis not present

## 2023-01-29 DIAGNOSIS — F439 Reaction to severe stress, unspecified: Secondary | ICD-10-CM | POA: Diagnosis not present

## 2023-02-05 DIAGNOSIS — F439 Reaction to severe stress, unspecified: Secondary | ICD-10-CM | POA: Diagnosis not present

## 2023-02-12 DIAGNOSIS — F439 Reaction to severe stress, unspecified: Secondary | ICD-10-CM | POA: Diagnosis not present

## 2023-02-18 DIAGNOSIS — F439 Reaction to severe stress, unspecified: Secondary | ICD-10-CM | POA: Diagnosis not present

## 2023-02-27 DIAGNOSIS — F439 Reaction to severe stress, unspecified: Secondary | ICD-10-CM | POA: Diagnosis not present

## 2023-03-14 DIAGNOSIS — F439 Reaction to severe stress, unspecified: Secondary | ICD-10-CM | POA: Diagnosis not present

## 2023-03-20 DIAGNOSIS — F439 Reaction to severe stress, unspecified: Secondary | ICD-10-CM | POA: Diagnosis not present

## 2023-03-28 DIAGNOSIS — F4310 Post-traumatic stress disorder, unspecified: Secondary | ICD-10-CM | POA: Diagnosis not present

## 2023-04-02 DIAGNOSIS — F4310 Post-traumatic stress disorder, unspecified: Secondary | ICD-10-CM | POA: Diagnosis not present

## 2023-04-10 DIAGNOSIS — F4310 Post-traumatic stress disorder, unspecified: Secondary | ICD-10-CM | POA: Diagnosis not present

## 2023-04-17 DIAGNOSIS — F4310 Post-traumatic stress disorder, unspecified: Secondary | ICD-10-CM | POA: Diagnosis not present

## 2023-04-24 DIAGNOSIS — F4310 Post-traumatic stress disorder, unspecified: Secondary | ICD-10-CM | POA: Diagnosis not present

## 2023-05-01 DIAGNOSIS — F4310 Post-traumatic stress disorder, unspecified: Secondary | ICD-10-CM | POA: Diagnosis not present

## 2023-05-09 DIAGNOSIS — F4310 Post-traumatic stress disorder, unspecified: Secondary | ICD-10-CM | POA: Diagnosis not present

## 2023-05-13 DIAGNOSIS — F4310 Post-traumatic stress disorder, unspecified: Secondary | ICD-10-CM | POA: Diagnosis not present

## 2023-05-15 ENCOUNTER — Ambulatory Visit: Payer: 59 | Admitting: Nurse Practitioner

## 2023-05-15 NOTE — Progress Notes (Deleted)
   There were no vitals taken for this visit.   Subjective:    Patient ID: Daisy Torres, female    DOB: 01-Dec-1995, 28 y.o.   MRN: [id number]  HPI: Daisy Torres is a 28 y.o. female  No chief complaint on file.  Patient presents to clinic to establish care with new PCP.  Introduced to Daisy Torres role and practice setting.  All questions answered.  Discussed provider/patient relationship and expectations.  Patient reports a history of ***. Patient denies a history of: Hypertension, Elevated Cholesterol, Diabetes, Thyroid problems, Depression, Anxiety, Neurological problems, and Abdominal problems.   Active Ambulatory Problems    Diagnosis Date Noted   Adjustment disorder with mixed anxiety and depressed mood 12/19/2016   Overweight BMI=27.2 12/18/2016   Resolved Ambulatory Problems    Diagnosis Date Noted   No Resolved Ambulatory Problems   No Additional Past Medical History   Past Surgical History:  Procedure Laterality Date   butt lift Bilateral    Brazillian Butt lift   EYE SURGERY     Family History  Problem Relation Age of Onset   Alcohol abuse Mother    Alcohol abuse Maternal Uncle    Diabetes Maternal Grandmother    Heart disease Maternal Grandfather    Drug abuse Maternal Grandfather      Review of Systems  Per HPI unless specifically indicated above     Objective:    There were no vitals taken for this visit.  Wt Readings from Last 3 Encounters:  12/17/22 138 lb 3.2 oz (62.7 kg)  12/12/22 139 lb (63 kg)  12/10/22 139 lb 9.6 oz (63.3 kg)    Physical Exam  Results for orders placed or performed during the hospital encounter of 12/12/22  CBC   Collection Time: 12/12/22  6:53 AM  Result Value Ref Range   WBC 4.6 4.0 - 10.5 K/uL   RBC 3.60 (L) 3.87 - 5.11 MIL/uL   Hemoglobin 11.1 (L) 12.0 - 15.0 g/dL   HCT 82.9 (L) 56.2 - 13.0 %   MCV 91.1 80.0 - 100.0 fL   MCH 30.8 26.0 - 34.0 pg   MCHC 33.8 30.0 - 36.0 g/dL   RDW 86.5 78.4 - 69.6 %    Platelets 207 150 - 400 K/uL   nRBC 0.0 0.0 - 0.2 %  Basic metabolic panel   Collection Time: 12/12/22  6:53 AM  Result Value Ref Range   Sodium 136 135 - 145 mmol/L   Potassium 3.5 3.5 - 5.1 mmol/L   Chloride 108 98 - 111 mmol/L   CO2 23 22 - 32 mmol/L   Glucose, Bld 80 70 - 99 mg/dL   BUN 8 6 - 20 mg/dL   Creatinine, Ser 2.95 0.44 - 1.00 mg/dL   Calcium 8.7 (L) 8.9 - 10.3 mg/dL   GFR, Estimated >28 >41 mL/min   Anion gap 5 5 - 15  Troponin I (High Sensitivity)   Collection Time: 12/12/22  6:53 AM  Result Value Ref Range   Troponin I (High Sensitivity) <2 <18 ng/L      Assessment & Plan:   Problem List Items Addressed This Visit   None    Follow up plan: No follow-ups on file.

## 2023-05-23 DIAGNOSIS — F4310 Post-traumatic stress disorder, unspecified: Secondary | ICD-10-CM | POA: Diagnosis not present

## 2023-05-28 DIAGNOSIS — F4310 Post-traumatic stress disorder, unspecified: Secondary | ICD-10-CM | POA: Diagnosis not present

## 2023-06-04 DIAGNOSIS — F4310 Post-traumatic stress disorder, unspecified: Secondary | ICD-10-CM | POA: Diagnosis not present

## 2023-06-16 NOTE — Progress Notes (Unsigned)
 [employer] [facility] DEPARTMENT [facility] [address], [address] [place] [address] Main phone: [phone]  Family Planning Visit - Repeat Yearly Visit  Subjective:  Daisy Torres is a 28 y.o. G0P0000  being seen today for an annual wellness visit and to discuss contraception options. The patient is currently using {Upstream End Methods:32236} for pregnancy prevention. Patient {want preg?:32238::"does not want a pregnancy"} in the next year.   Patient reports they are looking for a method with the following characteristics:  {Contraception wants:32235:p}  Patient has the following medical problems:  Patient Active Problem List   Diagnosis Date Noted   Adjustment disorder with mixed anxiety and depressed mood 12/19/2016   Overweight BMI=27.2 12/18/2016    No chief complaint on file.   HPI Patient is a pleasant 28 y.o. female who presents to the office today for annual well woman exam and is requesting symptomatic/asymptomatic STI testing.  Patient reports concerns today include the following:  Patient indicates ----- female/female partners in the last 2 months. She reports practicing vaginal, oral, anal sex and uses condoms sometimes. Patient indicates no STI history. Patient reports last sex was ---------. She indicates use of/ no use of -------------- as contraception method.  Patient indicates LMP was ------------.    ROS  See flowsheet for other program required questions.   Diabetes screening This patient is 28 y.o. with a BMI of There is no height or weight on file to calculate BMI..  Is patient eligible for diabetes screening (age >35 and BMI >25)?  {YES/NO/NOT APPLICABLE:20182}  Was Hgb A1c ordered? {YES/NO/NOT APPLICABLE:20182}  STI screening Patient reports {NUMBER 1-10:22536} of partners in last year.  Does this patient desire STI screening?  {Yes or If no, why not?:20788}  Hepatitis C screening Has patient been screened once  for HCV in the past?  {yes/no:20286}  No results found for: "HCVAB"  Does the patient meet criteria for HCV testing? {yes/no:20286}  ***(If yes-- Screen for HCV through [facility]) Criteria:  Since the last HCV result, does the patient have any of the following? - Current drug use - Have a partner with drug use - Has been incarcerated  Hepatitis B screening Does the patient meet criteria for HBV testing? {yes/no:20286} Criteria:  -Household, sexual or needle sharing contact with HBV -History of drug use -HIV positive -Those with known Hep C  Cervical Cancer Screening  Result Date Procedure Results Follow-ups  05/24/2022 IGP, rfx Aptima HPV ASCU DIAGNOSIS:: Comment Specimen adequacy:: Comment Clinician Provided ICD10: Comment Performed by:: Comment PAP Smear Comment: . Note:: Comment Test Methodology: Comment PAP Reflex: Comment   04/01/2019 IGP, rfx Aptima HPV ASCU DIAGNOSIS:: Comment Specimen adequacy:: Comment Clinician Provided ICD10: Comment Performed by:: Comment PAP Smear Comment: . Note:: Comment Test Methodology: Comment PAP Reflex: Comment   04/14/2015 HM PAP SMEAR HM Pap smear: Negative     Health Maintenance Due  Topic Date Due   DTaP/Tdap/Td (1 - Tdap) Never done   COVID-19 Vaccine (3 - 2024-25 season) 11/11/2022    The following portions of the patient's history were reviewed and updated as appropriate: allergies, current medications, past family history, past medical history, past social history, past surgical history and problem list. Problem list updated.  Objective:  There were no vitals filed for this visit.  Physical Exam  Assessment and Plan:  Daisy Torres is a 28 y.o. female G0P0000 presenting to the [facility] Department for an yearly wellness and contraception visit  1.  Family planning (Primary) Contraception counseling: Reviewed options based on patient desire and reproductive life plan. Patient is interested in {Upstream  End Methods:[address]}. This {WAS/WAS NOT:[phone]::"was not"} provided to the patient today.   Risks, benefits, and typical effectiveness rates were reviewed.  Questions were answered.  Written information was also given to the patient to review.    The patient will follow up in  {NUMBER 1-10:22536} {days/wks/mos/yrs:310907} for surveillance.  The patient was told to call with any further questions, or with any concerns about this method of contraception.  Emphasized use of condoms 100% of the time for STI prevention.  Educated on ECP and assessed need for ECP. Patient was offered ECP based on {unprotected sex categories:26659}.  Patient is within {NUMBER 1-10:22536} days of unprotected sex. Patient was offered ECP. Reviewed options and patient desired {ECP options:27263}   2. Well woman exam PAP not due today. Due in 2 years with HPV co-testing. CBE performed/not performed today. Patient has PCP. Encouraged to see regarding concerns today.  Discussed lifestyle modifications to promote healthy weight management including diet and exercise such as increasing fruit, vegetable, and whole grain intake. Being mindful of portion size and to limit high-fat, high-cholesterol foods like those that are fried, red meats, and dairy products. Also limit simple carbohydrates and foods and beverages high in sugar.  Increase water intake.  Patient encouraged to exercise for at least 30 minutes at a moderate activity level 5 times a week or for 3 times a week with a vigorous 1-hour exercise routine. Patient encouraged to make gradual changes and not over exert self.    3. Screening for venereal disease    No follow-ups on file.  Future Appointments  Date Time Provider Department Center  06/17/2023 10:50 AM [facility] PROVIDER [facility] None    Edmonia James, NP

## 2023-06-17 ENCOUNTER — Encounter: Admitting: Nurse Practitioner

## 2023-06-17 DIAGNOSIS — M9903 Segmental and somatic dysfunction of lumbar region: Secondary | ICD-10-CM | POA: Diagnosis not present

## 2023-06-17 DIAGNOSIS — Z3009 Encounter for other general counseling and advice on contraception: Secondary | ICD-10-CM

## 2023-06-17 DIAGNOSIS — M5417 Radiculopathy, lumbosacral region: Secondary | ICD-10-CM | POA: Diagnosis not present

## 2023-06-17 DIAGNOSIS — Z113 Encounter for screening for infections with a predominantly sexual mode of transmission: Secondary | ICD-10-CM

## 2023-06-17 DIAGNOSIS — Z01419 Encounter for gynecological examination (general) (routine) without abnormal findings: Secondary | ICD-10-CM

## 2023-06-17 DIAGNOSIS — M9905 Segmental and somatic dysfunction of pelvic region: Secondary | ICD-10-CM | POA: Diagnosis not present

## 2023-06-17 DIAGNOSIS — M5416 Radiculopathy, lumbar region: Secondary | ICD-10-CM | POA: Diagnosis not present

## 2023-06-17 NOTE — Progress Notes (Signed)
 This provider went into patient room to begin provider portion of visit including interview and exam. Patient asked how long it would take and stated she needed to leave ASAP as she had other appts to get to.  Patient rescheduled appt for 06/20/23 at 3pm with this provider.  Marylu Lund L. Maddax Palinkas, FNP-C

## 2023-06-17 NOTE — Progress Notes (Signed)
 Pt left without provider exam, condoms given. Gaspar Garbe, RN

## 2023-06-19 DIAGNOSIS — M5417 Radiculopathy, lumbosacral region: Secondary | ICD-10-CM | POA: Diagnosis not present

## 2023-06-19 DIAGNOSIS — M9903 Segmental and somatic dysfunction of lumbar region: Secondary | ICD-10-CM | POA: Diagnosis not present

## 2023-06-19 DIAGNOSIS — M5416 Radiculopathy, lumbar region: Secondary | ICD-10-CM | POA: Diagnosis not present

## 2023-06-19 DIAGNOSIS — M9905 Segmental and somatic dysfunction of pelvic region: Secondary | ICD-10-CM | POA: Diagnosis not present

## 2023-06-19 DIAGNOSIS — F4310 Post-traumatic stress disorder, unspecified: Secondary | ICD-10-CM | POA: Diagnosis not present

## 2023-06-20 ENCOUNTER — Ambulatory Visit (LOCAL_COMMUNITY_HEALTH_CENTER): Admitting: Nurse Practitioner

## 2023-06-20 ENCOUNTER — Encounter: Payer: Self-pay | Admitting: Nurse Practitioner

## 2023-06-20 VITALS — BP 125/85 | HR 83 | Ht 65.0 in | Wt 146.6 lb

## 2023-06-20 DIAGNOSIS — Z3009 Encounter for other general counseling and advice on contraception: Secondary | ICD-10-CM | POA: Diagnosis not present

## 2023-06-20 DIAGNOSIS — Z0000 Encounter for general adult medical examination without abnormal findings: Secondary | ICD-10-CM | POA: Diagnosis not present

## 2023-06-20 DIAGNOSIS — Z113 Encounter for screening for infections with a predominantly sexual mode of transmission: Secondary | ICD-10-CM

## 2023-06-20 LAB — WET PREP FOR TRICH, YEAST, CLUE
Trichomonas Exam: NEGATIVE
Yeast Exam: NEGATIVE

## 2023-06-20 LAB — HM HIV SCREENING LAB: HM HIV Screening: NEGATIVE

## 2023-06-20 LAB — HEPATITIS B SURFACE ANTIGEN

## 2023-06-20 LAB — HM HEPATITIS C SCREENING LAB: HM Hepatitis Screen: NEGATIVE

## 2023-06-20 MED ORDER — NORETHIN-ETH ESTRAD-FE BIPHAS 1 MG-10 MCG / 10 MCG PO TABS
1.0000 | ORAL_TABLET | Freq: Every day | ORAL | 13 refills | Status: DC
Start: 2023-07-07 — End: 2023-11-01

## 2023-06-20 MED ORDER — LEVONORGESTREL 1.5 MG PO TABS: 1.5000 mg | ORAL_TABLET | Freq: Once | ORAL | 0 refills | Status: AC

## 2023-06-20 NOTE — Progress Notes (Signed)
 Patient is here for PE and contraception. Wet prep results reviewed with pt and requires no treatment. Condoms declined. Sonda Primes, RN.

## 2023-06-20 NOTE — Progress Notes (Signed)
 [employer] HEALTH DEPARTMENT [facility] [address], [address] [place] [address] Main phone: [phone]  Family Planning Visit - Repeat Yearly Visit  Subjective:  Daisy Torres is a 28 y.o. G0P0000  being seen today for an annual wellness visit and to discuss contraception options. The patient is currently using no method - ECP and condoms sometimes  for pregnancy prevention. Patient does not want a pregnancy in the next year.   Patient reports they are looking for a method with the following characteristics:  Only want ECP today. Does not want a long term method.   Patient has the following medical problems:  Patient Active Problem List   Diagnosis Date Noted   Adjustment disorder with mixed anxiety and depressed mood 12/19/2016   Overweight BMI=27.2 12/18/2016    Chief Complaint  Patient presents with   Contraception    HPI Patient is a pleasant 28 y.o. female who presents to the office today requesting PE and symptomatic STI testing.  Today the patient indicates abnormal vaginal discharge that is white, thin, and with an odor. She also reports some burning with urination and increased urinary frequency. Patient is unsure when these symptom started.  Patient indicates 1 female partner in the last 2 months. She reports practicing vaginal, oral, anal sex and uses condoms sometimes. Patient indicates no STI history. Patient reports last sex was 06/18/23. She indicates no use of a contraception method and does desire ECP today. Patient indicates LMP was 06/06/23.    Review of Systems  Constitutional:  Negative for weight loss.  HENT:  Negative for sore throat.   Eyes:  Negative for blurred vision.  Respiratory:  Negative for cough, shortness of breath and wheezing.   Cardiovascular:  Negative for chest pain and claudication.  Gastrointestinal:  Negative for nausea and vomiting.  Genitourinary:  Positive for dysuria and frequency.        Positive for vaginal discharge  Skin:  Negative for rash.  Neurological:  Negative for dizziness, seizures and headaches.  Endo/Heme/Allergies:  Does not bruise/bleed easily.    See flowsheet for other program required questions.   Diabetes screening This patient is 28 y.o. with a BMI of Body mass index is 24.4 kg/m.Marland Kitchen  Is patient eligible for diabetes screening (age >35 and BMI >25)?  no  Was Hgb A1c ordered? not applicable  STI screening Patient reports 1 of partners in last year.  Does this patient desire STI screening?  Yes  Hepatitis C screening Has patient been screened once for HCV in the past?  No  No results found for: "HCVAB"  Does the patient meet criteria for HCV testing? Yes  (If yes-- Screen for HCV through [facility]) Criteria:  Since the last HCV result, does the patient have any of the following? - Current drug use - Have a partner with drug use - Has been incarcerated  Hepatitis B screening Does the patient meet criteria for HBV testing? Yes Criteria:  -Household, sexual or needle sharing contact with HBV -History of drug use -HIV positive -Those with known Hep C  Cervical Cancer Screening  Result Date Procedure Results Follow-ups  05/24/2022 IGP, rfx Aptima HPV ASCU DIAGNOSIS:: Comment Specimen adequacy:: Comment Clinician Provided ICD10: Comment Performed by:: Comment PAP Smear Comment: . Note:: Comment Test Methodology: Comment PAP Reflex: Comment   04/01/2019 IGP, rfx Aptima HPV ASCU DIAGNOSIS:: Comment Specimen adequacy:: Comment Clinician Provided ICD10: Comment Performed by:: Comment PAP Smear Comment: . Note:: Comment Test  Methodology: Comment PAP Reflex: Comment   04/14/2015 HM PAP SMEAR HM Pap smear: Negative     Health Maintenance Due  Topic Date Due   DTaP/Tdap/Td (1 - Tdap) Never done   COVID-19 Vaccine (3 - 2024-25 season) 11/11/2022    The following portions of the patient's history were reviewed and updated as  appropriate: allergies, current medications, past family history, past medical history, past social history, past surgical history and problem list. Problem list updated.  Objective:   Vitals:   06/20/23 1525  BP: 125/85  Pulse: 83  Weight: 146 lb 9.6 oz (66.5 kg)  Height: 5\' 5"  (1.651 m)    Physical Exam Vitals and nursing note reviewed. Exam conducted with a chaperone present Cameron Proud, RN present in room as chaperone).  Constitutional:      Appearance: Normal appearance.  HENT:     Head: Normocephalic.     Salivary Glands: Right salivary gland is not diffusely enlarged or tender. Left salivary gland is not diffusely enlarged or tender.     Mouth/Throat:     Lips: Pink. No lesions.     Mouth: Mucous membranes are moist.     Tongue: No lesions. Tongue does not deviate from midline.     Pharynx: Oropharynx is clear. Uvula midline. No oropharyngeal exudate or posterior oropharyngeal erythema.     Tonsils: No tonsillar exudate.  Eyes:     General:        Right eye: No discharge.        Left eye: No discharge.  Neck:     Thyroid: No thyroid mass or thyroid tenderness.     Trachea: Trachea and phonation normal. No tracheal tenderness or tracheal deviation.  Cardiovascular:     Rate and Rhythm: Normal rate and regular rhythm.     Heart sounds: Normal heart sounds, S1 normal and S2 normal.  Pulmonary:     Effort: Pulmonary effort is normal.     Breath sounds: Normal breath sounds and air entry.  Abdominal:     General: Abdomen is flat. Bowel sounds are normal.     Palpations: Abdomen is soft.     Tenderness: There is no abdominal tenderness. There is no guarding or rebound.  Genitourinary:    General: Normal vulva.     Exam position: Lithotomy position.     Pubic Area: No rash or pubic lice.      Tanner stage (genital): 5.     Labia:        Right: No rash, tenderness, lesion or injury.        Left: No rash, tenderness, lesion or injury.      Vagina: Normal. No signs  of injury and foreign body. No vaginal discharge, erythema, tenderness, bleeding or lesions.     Cervix: Normal. No cervical motion tenderness, discharge, friability, lesion, erythema, cervical bleeding or eversion.     Uterus: Normal.      Adnexa: Right adnexa normal and left adnexa normal.     Comments: pH<4.5 Lymphadenopathy:     Head:     Right side of head: No submental, submandibular, tonsillar, preauricular or posterior auricular adenopathy.     Left side of head: No submental, submandibular, tonsillar, preauricular or posterior auricular adenopathy.     Cervical: No cervical adenopathy.     Right cervical: No superficial or posterior cervical adenopathy.    Left cervical: No superficial or posterior cervical adenopathy.     Upper Body:     Right upper  body: No supraclavicular or axillary adenopathy.     Left upper body: No supraclavicular or axillary adenopathy.     Lower Body: No right inguinal adenopathy. No left inguinal adenopathy.  Skin:    General: Skin is warm and dry.     Findings: No lesion or rash.     Comments: Skin tone appropriate for ethnicity.   Neurological:     Mental Status: She is alert and oriented to person, place, and time.  Psychiatric:        Attention and Perception: Attention and perception normal.        Mood and Affect: Mood and affect normal.        Speech: Speech normal.        Behavior: Behavior normal. Behavior is cooperative.        Thought Content: Thought content normal.     Assessment and Plan:  Daisy Torres is a 28 y.o. female G0P0000 presenting to the [facility] Department for an yearly wellness and contraception visit  1. Family planning (Primary) Contraception counseling: Reviewed options based on patient desire and reproductive life plan. Patient is interested in Oral Contraceptive. This was not provided to the patient today. Patient reported desire to try oral contraceptive pills. After shared-decision making  discussion with patient including available options and their side effects and benefits, patient decided she would like to try combined oral contraceptive pills. Patient was advised COC and ECP would be sent to pharmacy for pick up today. Advised she could take ECP today and start COC tomorrow. After patient left and further investigation, provider realized patient's reported last menstrual period began 2 weeks ago (06/06/23) and she has had sexual intercourse at least twice since then with no condom or protection. Provider cannot be reasonably sure patient is not pregnant when considering the CDC guidelines to rule out pregnancy. Discussed options with supervising physician (Dr. Fayette Pho) who agreed patient should not start COC until 1 of the following is met, 1) patient has no sexual intercourse (with or without a condom) and begins COC immediately after next menstrual cycle starts, or 2) patient takes ECP now, has no sexual intercourse (with or without a condom) and has a negative pregnancy test in 1-2 weeks starting COC immediately following negative test. Provider tried to reach patient by phone number listed in chart and got voicemail. Left a message for patient to return provider call and gave direct provider line.   Risks, benefits, and typical effectiveness rates were reviewed.  Questions were answered.  Written information was also given to the patient to review.    The patient will follow up in  1 years for surveillance.  The patient was told to call with any further questions, or with any concerns about this method of contraception.  Emphasized use of condoms 100% of the time for STI prevention.  Educated on ECP and assessed need for ECP. Patient was offered ECP based on Unprotected sex within past 72 hours.  Patient is within 2 days of unprotected sex. Patient was offered ECP. Reviewed options and patient desired Plan B (levonorgestrel)    - levonorgestrel (PLAN B ONE-STEP) 1.5 MG tablet;  Take 1 tablet (1.5 mg total) by mouth once for 1 dose.  Dispense: 1 tablet; Refill: 0 - Norethindrone-Ethinyl Estradiol-Fe Biphas (LO LOESTRIN FE) 1 MG-10 MCG / 10 MCG tablet; Take 1 tablet by mouth daily.  Dispense: 28 tablet; Refill: 13  2. Well woman exam (no gynecological exam) PAP not performed  as not due until 05/2025.  CBE performed in office today. Next due 06/2026 unless symptoms or concerns arise sooner.  Patient has PCP.  Patient reports no depression today and her mental health symptoms immediately began to improve once hormonal IUD was removed 6 months ago.   3. Screening for venereal disease Wet prep negative in office today.  - Chlamydia/Gonorrhea [place] [facility] - Gonococcus culture - HBV Antigen/Antibody [facility] - HIV/HCV [place] [facility] - WET PREP FOR TRICH, YEAST, CLUE - Syphilis Serology, [place] [facility]  Return in about 1 year (around 06/19/2024) for annual well-woman exam.  No future appointments.  Edmonia James, NP

## 2023-06-21 ENCOUNTER — Telehealth: Payer: Self-pay | Admitting: Nurse Practitioner

## 2023-06-21 NOTE — Telephone Encounter (Signed)
 Spoke with patient by phone today regarding starting COC. Explained to patient that according to CDC guidelines we cannot be reasonably sure she is not pregnant. Provided patient with 2 options of what to do now and when to start COC. Please see 06/20/23 encounter note for options under Assessment and Plan. Patient verbalized understanding and agreed with plan. Advised patient to contact me with any questions or concerns.  Marylu Lund L. Maeve Debord, FNP-C

## 2023-06-26 DIAGNOSIS — F4310 Post-traumatic stress disorder, unspecified: Secondary | ICD-10-CM | POA: Diagnosis not present

## 2023-06-26 LAB — GONOCOCCUS CULTURE

## 2023-07-05 DIAGNOSIS — F4310 Post-traumatic stress disorder, unspecified: Secondary | ICD-10-CM | POA: Diagnosis not present

## 2023-07-10 DIAGNOSIS — F4310 Post-traumatic stress disorder, unspecified: Secondary | ICD-10-CM | POA: Diagnosis not present

## 2023-07-11 ENCOUNTER — Ambulatory Visit

## 2023-07-19 DIAGNOSIS — F4310 Post-traumatic stress disorder, unspecified: Secondary | ICD-10-CM | POA: Diagnosis not present

## 2023-07-23 DIAGNOSIS — M5416 Radiculopathy, lumbar region: Secondary | ICD-10-CM | POA: Diagnosis not present

## 2023-07-23 DIAGNOSIS — M9903 Segmental and somatic dysfunction of lumbar region: Secondary | ICD-10-CM | POA: Diagnosis not present

## 2023-07-23 DIAGNOSIS — M5417 Radiculopathy, lumbosacral region: Secondary | ICD-10-CM | POA: Diagnosis not present

## 2023-07-23 DIAGNOSIS — M9905 Segmental and somatic dysfunction of pelvic region: Secondary | ICD-10-CM | POA: Diagnosis not present

## 2023-07-30 DIAGNOSIS — F4310 Post-traumatic stress disorder, unspecified: Secondary | ICD-10-CM | POA: Diagnosis not present

## 2023-07-30 DIAGNOSIS — M9903 Segmental and somatic dysfunction of lumbar region: Secondary | ICD-10-CM | POA: Diagnosis not present

## 2023-07-30 DIAGNOSIS — M5417 Radiculopathy, lumbosacral region: Secondary | ICD-10-CM | POA: Diagnosis not present

## 2023-07-30 DIAGNOSIS — M9905 Segmental and somatic dysfunction of pelvic region: Secondary | ICD-10-CM | POA: Diagnosis not present

## 2023-07-30 DIAGNOSIS — M5416 Radiculopathy, lumbar region: Secondary | ICD-10-CM | POA: Diagnosis not present

## 2023-08-06 DIAGNOSIS — M9903 Segmental and somatic dysfunction of lumbar region: Secondary | ICD-10-CM | POA: Diagnosis not present

## 2023-08-06 DIAGNOSIS — M5416 Radiculopathy, lumbar region: Secondary | ICD-10-CM | POA: Diagnosis not present

## 2023-08-06 DIAGNOSIS — M9905 Segmental and somatic dysfunction of pelvic region: Secondary | ICD-10-CM | POA: Diagnosis not present

## 2023-08-06 DIAGNOSIS — M5417 Radiculopathy, lumbosacral region: Secondary | ICD-10-CM | POA: Diagnosis not present

## 2023-08-09 DIAGNOSIS — F4310 Post-traumatic stress disorder, unspecified: Secondary | ICD-10-CM | POA: Diagnosis not present

## 2023-08-15 DIAGNOSIS — M9905 Segmental and somatic dysfunction of pelvic region: Secondary | ICD-10-CM | POA: Diagnosis not present

## 2023-08-15 DIAGNOSIS — M5417 Radiculopathy, lumbosacral region: Secondary | ICD-10-CM | POA: Diagnosis not present

## 2023-08-15 DIAGNOSIS — M9903 Segmental and somatic dysfunction of lumbar region: Secondary | ICD-10-CM | POA: Diagnosis not present

## 2023-08-15 DIAGNOSIS — M5416 Radiculopathy, lumbar region: Secondary | ICD-10-CM | POA: Diagnosis not present

## 2023-08-16 DIAGNOSIS — F4310 Post-traumatic stress disorder, unspecified: Secondary | ICD-10-CM | POA: Diagnosis not present

## 2023-08-22 ENCOUNTER — Ambulatory Visit: Admitting: Family Medicine

## 2023-08-22 ENCOUNTER — Encounter: Payer: Self-pay | Admitting: Family Medicine

## 2023-08-22 VITALS — BP 93/66 | HR 91 | Ht 65.0 in | Wt 144.8 lb

## 2023-08-22 DIAGNOSIS — Z30011 Encounter for initial prescription of contraceptive pills: Secondary | ICD-10-CM

## 2023-08-22 DIAGNOSIS — M9905 Segmental and somatic dysfunction of pelvic region: Secondary | ICD-10-CM | POA: Diagnosis not present

## 2023-08-22 DIAGNOSIS — M5416 Radiculopathy, lumbar region: Secondary | ICD-10-CM | POA: Diagnosis not present

## 2023-08-22 DIAGNOSIS — M5417 Radiculopathy, lumbosacral region: Secondary | ICD-10-CM | POA: Diagnosis not present

## 2023-08-22 DIAGNOSIS — M9903 Segmental and somatic dysfunction of lumbar region: Secondary | ICD-10-CM | POA: Diagnosis not present

## 2023-08-22 DIAGNOSIS — Z3009 Encounter for other general counseling and advice on contraception: Secondary | ICD-10-CM

## 2023-08-22 DIAGNOSIS — Z113 Encounter for screening for infections with a predominantly sexual mode of transmission: Secondary | ICD-10-CM

## 2023-08-22 LAB — WET PREP FOR TRICH, YEAST, CLUE
Clue Cell Exam: POSITIVE — AB
Trichomonas Exam: NEGATIVE
Yeast Exam: NEGATIVE

## 2023-08-22 LAB — HM HIV SCREENING LAB: HM HIV Screening: NEGATIVE

## 2023-08-22 MED ORDER — NORETHIN ACE-ETH ESTRAD-FE 1-20 MG-MCG PO TABS
1.0000 | ORAL_TABLET | Freq: Every day | ORAL | 9 refills | Status: DC
Start: 2023-08-22 — End: 2023-11-01

## 2023-08-22 MED ORDER — LEVONORGESTREL 1.5 MG PO TABS: 1.5000 mg | ORAL_TABLET | Freq: Once | ORAL | 0 refills | Status: AC

## 2023-08-22 NOTE — Progress Notes (Signed)
 Pt is here for an acute visit today. Wet prep reviewed with patient. The patient was dispensed Microgestin FE and plan B  today to be picked up at the pharmacy. I provided counseling today regarding the medication. We discussed the medication, the side effects and when to call clinic. Condoms given. Patient given the opportunity to ask questions for any clarification. Austine Lefort, RN.

## 2023-08-22 NOTE — Progress Notes (Signed)
   [facility] Problem Visit  [facility]  Subjective:  Daisy Torres is a 28 y.o. being seen today for   Chief Complaint  Patient presents with   Acute Visit    Pt is here for an acute visit/BC/STI check    HPI  Reports she wants to get started on OCPs. Desires STI Testing but reports no symptoms.    Health Maintenance Due  Topic Date Due   DTaP/Tdap/Td (1 - Tdap) Never done   COVID-19 Vaccine (3 - 2024-25 season) 11/11/2022    ROS  The following portions of the patient's history were reviewed and updated as appropriate: allergies, current medications, past family history, past medical history, past social history, past surgical history and problem list. Problem list updated.   See flowsheet for other program required questions.  Objective:   Vitals:   08/22/23 1340  BP: 93/66  Pulse: 91  Weight: 144 lb 12.8 oz (65.7 kg)  Height: 5' 5 (1.651 m)    Physical Exam  Deferred- done in April- self swabbed  Assessment and Plan:  Daisy Torres is a 28 y.o. female presenting to the [facility] Department for a Women's Health problem visit  1. Family planning (Primary)  - levonorgestrel  (PLAN B  ONE-STEP) 1.5 MG tablet; Take 1 tablet (1.5 mg total) by mouth once for 1 dose.  Dispense: 1 tablet; Refill: 0 - norethindrone-ethinyl estradiol-FE (MICROGESTIN FE 1/20) 1-20 MG-MCG tablet; Take 1 tablet by mouth daily.  Dispense: 28 tablet; Refill: 9  2. Screening for venereal disease  - WET PREP FOR TRICH, YEAST, CLUE - Chlamydia/Gonorrhea [place] [facility] - HIV [place] [facility] - Syphilis Serology, [place] [facility]     Return if symptoms worsen or fail to improve, for STI screening.  No future appointments.  Earleen Glazier, [place]

## 2023-08-29 DIAGNOSIS — M9905 Segmental and somatic dysfunction of pelvic region: Secondary | ICD-10-CM | POA: Diagnosis not present

## 2023-08-29 DIAGNOSIS — M5417 Radiculopathy, lumbosacral region: Secondary | ICD-10-CM | POA: Diagnosis not present

## 2023-08-29 DIAGNOSIS — M9903 Segmental and somatic dysfunction of lumbar region: Secondary | ICD-10-CM | POA: Diagnosis not present

## 2023-08-29 DIAGNOSIS — M5416 Radiculopathy, lumbar region: Secondary | ICD-10-CM | POA: Diagnosis not present

## 2023-08-30 ENCOUNTER — Telehealth: Payer: Self-pay | Admitting: Family Medicine

## 2023-09-03 ENCOUNTER — Encounter: Payer: Self-pay | Admitting: Family Medicine

## 2023-09-05 DIAGNOSIS — M5416 Radiculopathy, lumbar region: Secondary | ICD-10-CM | POA: Diagnosis not present

## 2023-09-05 DIAGNOSIS — M9905 Segmental and somatic dysfunction of pelvic region: Secondary | ICD-10-CM | POA: Diagnosis not present

## 2023-09-05 DIAGNOSIS — M5417 Radiculopathy, lumbosacral region: Secondary | ICD-10-CM | POA: Diagnosis not present

## 2023-09-05 DIAGNOSIS — M9903 Segmental and somatic dysfunction of lumbar region: Secondary | ICD-10-CM | POA: Diagnosis not present

## 2023-09-06 DIAGNOSIS — F4310 Post-traumatic stress disorder, unspecified: Secondary | ICD-10-CM | POA: Diagnosis not present

## 2023-09-27 ENCOUNTER — Other Ambulatory Visit: Payer: Self-pay | Admitting: Medical Genetics

## 2023-10-30 ENCOUNTER — Ambulatory Visit

## 2023-11-01 ENCOUNTER — Ambulatory Visit

## 2023-11-01 DIAGNOSIS — Z5900 Homelessness unspecified: Secondary | ICD-10-CM

## 2023-11-01 DIAGNOSIS — Z113 Encounter for screening for infections with a predominantly sexual mode of transmission: Secondary | ICD-10-CM

## 2023-11-01 LAB — WET PREP FOR TRICH, YEAST, CLUE
Clue Cell Exam: NEGATIVE
Trichomonas Exam: NEGATIVE
Yeast Exam: NEGATIVE

## 2023-11-01 LAB — HM HEPATITIS C SCREENING LAB: HM Hepatitis Screen: NEGATIVE

## 2023-11-01 LAB — HM HIV SCREENING LAB: HM HIV Screening: NEGATIVE

## 2023-11-01 NOTE — Progress Notes (Signed)
 [facility] Department STI clinic [address] [place] [address] Main phone: [phone]  STI screening visit  Subjective:  Daisy Torres is a 28 y.o. female being seen today for an STI screening visit. The patient reports they do have symptoms.  Patient reports that they do not desire a pregnancy in the next year.   They reported they are not interested in discussing contraception today, but she wants to come back to discuss at a later visit.   Patient's last menstrual period was 10/09/2023 (exact date).  Patient has the following medical conditions:  Patient Active Problem List   Diagnosis Date Noted   Adjustment disorder with mixed anxiety and depressed mood 12/19/2016   Overweight BMI=27.2 12/18/2016   Chief Complaint  Patient presents with   SEXUALLY TRANSMITTED DISEASE   HPI Patient reports vaginal odor after sex, itching, discharge and discomfort for several days. She is sexually active with one female partner. She reports she is currently living in her car. Drives for [employer]. Has boyfriend who she will stay with sometimes, showers there.   Does the patient using douching products? No  See flowsheet for further details and programmatic requirements Hyperlink available at the top of the signed note in blue.  Flow sheet content below:  Pregnancy Intention Screening Does the patient want to become pregnant in the next year?: No Does the patient's partner want to become pregnant in the next year?: No Would the patient like to discuss contraceptive options today?: No Reason For STD Screen STD Screening: Has symptoms Have you ever had an STD?: Yes History of Antibiotic use in the past 2 weeks?: No STD Symptoms Denies all: No Genital Itching: Yes Lower abdominal pain: No Discharge: Yes Dysuria: No Genital ulcer / lesion: No Rash: No Vaginal irritation: Yes Oral / Other skin ulcer: No Pain with sex: No Sore Throat: No Visual  Changes: No Vaginal Bleeding: No Risk Factors for Hep B Household, sexual, or needle sharing contact of a person infected with Hep B: No Sexual contact with a person who uses drugs not as prescribed?: No Currently or Ever used drugs not as prescribed: Yes HIV Positive: No PRep Patient: No Men who have sex with men: No Have Hepatitis C: No History of Incarceration: No History of Homeslessness?: Yes Anal sex following anal drug use?: No Risk Factors for Hep C Currently using drugs not as prescribed: No Sexual partner(s) currently using drugs as not prescribed: No History of drug use: Yes HIV Positive: No People with a history of incarceration: No People born between the years of 33 and 74: Yes Abuse History Has patient ever been abused physically?: No Has patient ever been abused sexually?: No Does patient feel they have a problem with Anxiety?: Yes Does patient feel they have a problem with Depression?: Yes Referral to Behavioral Health:  (has therapist) Counseling Patient counseled to use condoms with all sex: Condoms declined RTC in 2-3 weeks for test results: Yes Clinic will call if test results abnormal before test result appt.: Yes Test results given to patient Patient counseled to use condoms with all sex: Condoms declined   Screening for MPX risk: Does the patient have an unexplained rash? No Is the patient MSM? No Does the patient endorse multiple sex partners or anonymous sex partners? No Did the patient have close or sexual contact with a person diagnosed with MPX? No Has the patient traveled outside the [place]  where MPX is endemic? No Is there a  high clinical suspicion for MPX-- evidenced by one of the following No  -Unlikely to be chickenpox  -Lymphadenopathy  -Rash that present in same phase of evolution on any given body part  Screenings: Last HIV test per patient/review of record was  Lab Results  Component Value Date   HMHIVSCREEN Negative - Validated  08/22/2023   No results found for: HIV   Last HEPC test per patient/review of record was  Lab Results  Component Value Date   HMHEPCSCREEN Negative-Validated 06/20/2023   No components found for: HEPC   Last HEPB test per patient/review of record was No components found for: HMHEPBSCREEN   Patient reports last pap was:   Lab Results  Component Value Date   SPECADGYN Comment 05/24/2022   Result Date Procedure Results Follow-ups  05/24/2022 IGP, rfx Aptima HPV ASCU DIAGNOSIS:: Comment Specimen adequacy:: Comment Clinician Provided ICD10: Comment Performed by:: Comment PAP Smear Comment: . Note:: Comment Test Methodology: Comment PAP Reflex: Comment   04/01/2019 IGP, rfx Aptima HPV ASCU DIAGNOSIS:: Comment Specimen adequacy:: Comment Clinician Provided ICD10: Comment Performed by:: Comment PAP Smear Comment: . Note:: Comment Test Methodology: Comment PAP Reflex: Comment   04/14/2015 HM PAP SMEAR HM Pap smear: Negative     Immunization history:  Immunization History  Administered Date(s) Administered   Hepatitis B 07-09-1993, 12/22/1993   Hpv-Unspecified 07/16/2007, 10/05/2008   Moderna Sars-Covid-2 Vaccination 11/15/2020, 12/13/2020    The following portions of the patient's history were reviewed and updated as appropriate: allergies, current medications, past medical history, past social history, past surgical history and problem list.  Objective:  There were no vitals filed for this visit.  Physical Exam Exam conducted with a chaperone present Brett Orange).  Constitutional:      Appearance: Normal appearance.  HENT:     Head: Normocephalic.     Mouth/Throat:     Mouth: Mucous membranes are moist.     Pharynx: No oropharyngeal exudate or posterior oropharyngeal erythema.  Eyes:     General: No scleral icterus.       Right eye: No discharge.        Left eye: No discharge.  Pulmonary:     Effort: Pulmonary effort is normal.  Genitourinary:    General:  Normal vulva.     Labia:        Right: No rash, tenderness, lesion or injury.        Left: No rash, tenderness, lesion or injury.      Vagina: Normal. No vaginal discharge, tenderness or lesions.     Cervix: No discharge, friability, lesion, erythema or cervical bleeding.     Comments: pH <4.5 Lymphadenopathy:     Cervical: No cervical adenopathy.     Right cervical: No superficial or posterior cervical adenopathy.    Left cervical: No superficial or posterior cervical adenopathy.  Skin:    General: Skin is warm and dry.  Neurological:     Mental Status: She is alert.  Psychiatric:        Mood and Affect: Mood normal.        Behavior: Behavior normal.    Assessment and Plan:  NYRAH DEMOS is a 28 y.o. female presenting to the [facility] Department for STI screening  1. Screening for venereal disease (Primary)  - HIV/HCV [place] [facility] - HBV Antigen/Antibody [facility] - WET PREP FOR TRICH, YEAST, CLUE negative - Chlamydia/Gonorrhea [place] [facility] - Syphilis Serology, [place] [facility] - Gonococcus culture - Gonococcus culture  2. Unhoused person - She lives in her car, drives for [place]. Has bed set up in vehicle and showers at her boyfriend's house. States she doesn't want to have to pay rent. - Declined social worker visit today due to time constraint - Provided her with list of local community resources, food banks/meal assistance  Patient accepted the following screenings: anal GC culture, oral GC culture, vaginal CT/GC swab, vaginal wet prep, HIV, RPR, Hep B, and Hep C Patient meets criteria for HepB screening? Yes. Ordered? yes Patient meets criteria for HepC screening? Yes. Ordered? yes  Treat wet prep per standing order Discussed time line for State Lab results and that patient will be called with positive results and encouraged patient to call if she had not heard in 2 weeks.  Counseled to return or seek care for continued or worsening symptoms Recommended  repeat testing in 3 months with positive results. Recommended condom use with all sex for STI prevention.   Patient is currently using condoms sometimes to prevent pregnancy.  May be interested in contraceptive ring, but does not have time to discuss today.  No follow-ups on file.  Future Appointments  Date Time Provider Department Center  11/04/2023  8:00 AM [facility] None    Damien FORBES Satchel, NP

## 2023-11-02 LAB — HBV ANTIGEN/ANTIBODY STATE LAB
Hep B Core Total Ab: NONREACTIVE
Hep B S Ab: NONREACTIVE
Hepatitis B Surface Antigen: NONREACTIVE

## 2023-11-04 ENCOUNTER — Other Ambulatory Visit

## 2023-11-06 LAB — GONOCOCCUS CULTURE

## 2023-11-13 ENCOUNTER — Encounter: Payer: Self-pay | Admitting: Family Medicine

## 2023-12-20 ENCOUNTER — Ambulatory Visit

## 2023-12-25 ENCOUNTER — Other Ambulatory Visit: Payer: Self-pay | Admitting: Genetic Counselor

## 2023-12-25 DIAGNOSIS — Z006 Encounter for examination for normal comparison and control in clinical research program: Secondary | ICD-10-CM

## 2024-01-14 ENCOUNTER — Ambulatory Visit

## 2024-01-15 ENCOUNTER — Ambulatory Visit (LOCAL_COMMUNITY_HEALTH_CENTER)

## 2024-01-15 VITALS — BP 107/65 | Ht 65.0 in | Wt 155.5 lb

## 2024-01-15 DIAGNOSIS — Z309 Encounter for contraceptive management, unspecified: Secondary | ICD-10-CM

## 2024-01-15 DIAGNOSIS — Z3201 Encounter for pregnancy test, result positive: Secondary | ICD-10-CM

## 2024-01-15 LAB — PREGNANCY, URINE: Preg Test, Ur: POSITIVE — AB

## 2024-01-15 MED ORDER — PRENATAL 27-0.8 MG PO TABS: 1.0000 | ORAL_TABLET | Freq: Every day | ORAL | Status: AC

## 2024-01-15 NOTE — Progress Notes (Signed)
 UPT positive. Unsure of prenatal plans. Considering option to terminate pregnancy but remains undecided. Local prenatal provider list given and encouraged to f-u with provider soon.  Confirms support system.   Positive pregnancy packet given and reviewed.   The patient was dispensed prenatal vitamins #100 today per [facility] Dr JAYSON Helling. I provided counseling today regarding the medication. We discussed the medication, the side effects and when to call clinic. Patient given the opportunity to ask questions. Questions answered.  Shazia Mitchener, RN

## 2024-02-11 ENCOUNTER — Ambulatory Visit: Admitting: Family Medicine

## 2024-02-11 ENCOUNTER — Ambulatory Visit

## 2024-02-11 VITALS — BP 122/82 | HR 75 | Ht 65.0 in | Wt 165.0 lb

## 2024-02-11 DIAGNOSIS — O039 Complete or unspecified spontaneous abortion without complication: Secondary | ICD-10-CM

## 2024-02-11 DIAGNOSIS — Z30013 Encounter for initial prescription of injectable contraceptive: Secondary | ICD-10-CM | POA: Diagnosis not present

## 2024-02-11 DIAGNOSIS — Z309 Encounter for contraceptive management, unspecified: Secondary | ICD-10-CM | POA: Diagnosis not present

## 2024-02-11 DIAGNOSIS — Z3201 Encounter for pregnancy test, result positive: Secondary | ICD-10-CM | POA: Diagnosis not present

## 2024-02-11 LAB — PREGNANCY, URINE: Preg Test, Ur: POSITIVE — AB

## 2024-02-11 MED ORDER — MEDROXYPROGESTERONE ACETATE 150 MG/ML IM SUSP
150.0000 mg | INTRAMUSCULAR | Status: AC
  Administered 2024-02-11: 150 mg via INTRAMUSCULAR

## 2024-02-11 NOTE — Patient Instructions (Signed)
 Depo Provera  Today we gave you the injectable birth control called Depo Provera .  Please come back in 11-12 weeks for your next injection.   Pregnancy hormone testing B-HCG test today.  We'll have you back every week or two for a blood draw until that is down to negative.

## 2024-02-11 NOTE — Progress Notes (Unsigned)
  [facility] [address], [address] [place] [address] Main phone: [phone]  Women's Health Problem Visit   Subjective:  Daisy Torres is a 28 y.o. G1P0010 being seen today for depo provera  initiation.   Chief Complaint  Patient presents with   Contraception   HPI S/p D&C 02/05/24 Improved cramping x 5 days Sharp pains in her breasts x 1 week Denies fever, chills, abnormal discharge, vaginal odor, pelvic pain  Does the patient have a current or past history of drug use? No   No components found for: HCV]  Health Maintenance Due  Topic Date Due   Hepatitis B Vaccines 19-59 Average Risk (3 of 3 - 3-dose series) 04/06/1994   DTaP/Tdap/Td (1 - Tdap) Never done   Influenza Vaccine  Never done   COVID-19 Vaccine (3 - 2025-26 season) 11/11/2023   Review of Systems  Constitutional:  Negative for fever, malaise/fatigue and weight loss.  Respiratory:  Negative for shortness of breath.   Cardiovascular:  Negative for chest pain and palpitations.   The following portions of the patient's history were reviewed and updated as appropriate: allergies, current medications, past family history, past medical history, past social history, past surgical history and problem list. Problem list updated.  See flowsheet for other program required questions.  Objective:   Vitals:   02/11/24 1443  BP: 122/82  Pulse: 75  Weight: 165 lb (74.8 kg)  Height: 5' 5 (1.651 m)   Physical Exam Vitals and nursing note reviewed.  Constitutional:      Appearance: Normal appearance.  HENT:     Head: Normocephalic.  Pulmonary:     Effort: Pulmonary effort is normal.  Musculoskeletal:        General: Normal range of motion.  Skin:    General: Skin is warm and dry.  Neurological:     Mental Status: She is alert. Mental status is at baseline.    Assessment and Plan:  Daisy Torres is a 28 y.o. female presenting to the  [facility] Department for a Women's Health problem visit  Encounter for initial prescription of injectable contraceptive Assessment & Plan: Depo provera  today. Will continue following B-HCG by venipuncture until negative.   Orders: -     Pregnancy, urine -     Beta hCG quant (ref lab) -     medroxyPROGESTERone  Acetate  Miscarriage Assessment & Plan: S/p D&C 02/05/2024. She cannot recall if the provider did post-procedure US . Does not have follow up scheduled for labs to trend down. B-HCG drawn today. Plan to draw B-HCG lab every 1-2 weeks until negative.     No follow-ups on file.  No future appointments.  Betsey CHRISTELLA Helling, MD

## 2024-02-11 NOTE — Progress Notes (Signed)
 Pt is here for Contraception. Depo IM injection given to pt at the Rt Deltoid and pt tolerated well to the injection with no complications. Condoms declined and reminder card given. Opportunity given to Patient to ask questions for any clarifications, questions answered and FP packet given. Wilkie Drought, RN.

## 2024-02-12 LAB — BETA HCG QUANT (REF LAB): hCG Quant: 3354 m[IU]/mL

## 2024-02-13 ENCOUNTER — Ambulatory Visit: Payer: Self-pay | Admitting: Family Medicine

## 2024-02-13 NOTE — Progress Notes (Signed)
 B-HCG follow up after IAB. Will continue to trend every 1-2 weeks until negative.   Dorothyann Helling, MD 02/13/24  10:43 AM

## 2024-02-14 ENCOUNTER — Encounter: Payer: Self-pay | Admitting: Family Medicine

## 2024-02-14 DIAGNOSIS — O039 Complete or unspecified spontaneous abortion without complication: Secondary | ICD-10-CM | POA: Insufficient documentation

## 2024-02-14 DIAGNOSIS — Z30013 Encounter for initial prescription of injectable contraceptive: Secondary | ICD-10-CM | POA: Insufficient documentation

## 2024-02-14 NOTE — Assessment & Plan Note (Signed)
 Depo provera  today. Will continue following B-HCG by venipuncture until negative.

## 2024-02-14 NOTE — Assessment & Plan Note (Signed)
 S/p D&C 02/05/2024. She cannot recall if the provider did post-procedure US . Does not have follow up scheduled for labs to trend down. B-HCG drawn today. Plan to draw B-HCG lab every 1-2 weeks until negative.

## 2024-02-24 ENCOUNTER — Telehealth: Payer: Self-pay | Admitting: Family Medicine

## 2024-02-24 NOTE — Telephone Encounter (Signed)
 See call log

## 2024-02-25 ENCOUNTER — Other Ambulatory Visit
